# Patient Record
Sex: Male | Born: 1990 | Race: White | Hispanic: No | Marital: Married | Smoking: Never smoker
Health system: Southern US, Community
[De-identification: ages and names within clinical notes are randomized; demographics above are authoritative.]

## PROBLEM LIST (undated history)

## (undated) DIAGNOSIS — J309 Allergic rhinitis, unspecified: Secondary | ICD-10-CM

## (undated) DIAGNOSIS — K219 Gastro-esophageal reflux disease without esophagitis: Secondary | ICD-10-CM

## (undated) DIAGNOSIS — J45909 Unspecified asthma, uncomplicated: Secondary | ICD-10-CM

## (undated) HISTORY — DX: Allergic rhinitis, unspecified: J30.9

## (undated) HISTORY — DX: Gastro-esophageal reflux disease without esophagitis: K21.9

---

## 2020-09-28 ENCOUNTER — Ambulatory Visit (INDEPENDENT_AMBULATORY_CARE_PROVIDER_SITE_OTHER): Payer: BC Managed Care – PPO

## 2020-09-28 ENCOUNTER — Ambulatory Visit
Admission: RE | Admit: 2020-09-28 | Discharge: 2020-09-28 | Disposition: A | Discharge status: Home / Self Care | Payer: BC Managed Care – PPO | Source: Ambulatory Visit

## 2020-09-28 ENCOUNTER — Other Ambulatory Visit: Payer: Self-pay

## 2020-09-28 VITALS — BP 105/68 | HR 122 | Temp 99.0°F | Resp 22

## 2020-09-28 DIAGNOSIS — R059 Cough, unspecified: Secondary | ICD-10-CM

## 2020-09-28 DIAGNOSIS — R0781 Pleurodynia: Secondary | ICD-10-CM

## 2020-09-28 DIAGNOSIS — J22 Unspecified acute lower respiratory infection: Secondary | ICD-10-CM

## 2020-09-28 MED ORDER — DOXYCYCLINE HYCLATE 100 MG PO CAPS: 100.0000 mg | ORAL_CAPSULE | Freq: Two times a day (BID) | ORAL | 0 refills | Status: DC

## 2020-09-28 MED ORDER — AMOXICILLIN 500 MG PO CAPS: 1000.0000 mg | ORAL_CAPSULE | Freq: Three times a day (TID) | ORAL | 0 refills | Status: AC

## 2020-09-28 MED ORDER — PREDNISONE 10 MG (21) PO TBPK: ORAL_TABLET | Freq: Every day | ORAL | 0 refills | Status: DC

## 2020-09-28 MED ORDER — BENZONATATE 100 MG PO CAPS: 100.0000 mg | ORAL_CAPSULE | Freq: Three times a day (TID) | ORAL | 0 refills | Status: DC

## 2020-09-28 NOTE — Discharge Instructions (Signed)
Chest x-ray negative for cardiopulmonary disease Get plenty of rest and push fluids Will treat for lower respiratory infection Amoxicillin and doxycycline prescribed Prednisone prescribed Prescribed tessolone perles as needed for cough Use OTC medication as needed for symptomatic relief Follow up with PCP this week for recheck and/or if symptoms persists Return or go to ER if you have any new or worsening symptoms such as fever, chills, fatigue, shortness of breath, wheezing, chest pain, nausea, changes in bowel or bladder habits, etc...  Discussed with patient that we cannot rule out blood clot.  Chest discomfort may be secondary for cartilage inflammation.  Advised to go to the ED if symptoms did not improve over the next day or two with medications.

## 2020-09-28 NOTE — ED Provider Notes (Signed)
[facility]   [id number] 09/28/20 Arrival Time: 1138  Cc: COUGH  SUBJECTIVE:  Michael Huber is a 29 y.o. male who presents with mild productive cough x 1 month, worsening symptoms over the past 1-3 days.  Denies positive sick exposure or precipitating event.   Treated for bronchitis (steroid and z-pak) with temporary relief.  Denies aggravating factors.   Denies previous symptoms in the past.   RT sided chest discomfort with cough.  Denies fever, chills, fatigue, sinus pain, rhinorrhea, sore throat, SOB, wheezing, chest pain, nausea, changes in bowel or bladder habits.    ROS: As per HPI.  All other pertinent ROS negative.     History reviewed. No pertinent past medical history. History reviewed. No pertinent surgical history. No Known Allergies No current facility-administered medications on file prior to encounter.   Current Outpatient Medications on File Prior to Encounter  Medication Sig Dispense Refill  . FLUoxetine (PROZAC) 20 MG capsule Take 1 capsule by mouth daily.    Marland Kitchen albuterol (VENTOLIN HFA) 108 (90 Base) MCG/ACT inhaler Inhale 2 puffs into the lungs 4 (four) times daily.    Marland Kitchen FLOVENT HFA 44 MCG/ACT inhaler Inhale 1 puff into the lungs daily.      Social History   Socioeconomic History  . Marital status: Married    Spouse name: Not on file  . Number of children: Not on file  . Years of education: Not on file  . Highest education level: Not on file  Occupational History  . Not on file  Tobacco Use  . Smoking status: Never Smoker  . Smokeless tobacco: Never Used  Substance and Sexual Activity  . Alcohol use: Yes  . Drug use: Not Currently  . Sexual activity: Not on file  Other Topics Concern  . Not on file  Social History Narrative  . Not on file   Social Determinants of Health   Financial Resource Strain:   . Difficulty of Paying Living Expenses: Not on file  Food Insecurity:   . Worried About Programme researcher, broadcasting/film/video in the Last Year: Not on file   . Ran Out of Food in the Last Year: Not on file  Transportation Needs:   . Lack of Transportation (Medical): Not on file  . Lack of Transportation (Non-Medical): Not on file  Physical Activity:   . Days of Exercise per Week: Not on file  . Minutes of Exercise per Session: Not on file  Stress:   . Feeling of Stress : Not on file  Social Connections:   . Frequency of Communication with Friends and Family: Not on file  . Frequency of Social Gatherings with Friends and Family: Not on file  . Attends Religious Services: Not on file  . Active Member of Clubs or Organizations: Not on file  . Attends Banker Meetings: Not on file  . Marital Status: Not on file  Intimate Partner Violence:   . Fear of Current or Ex-Partner: Not on file  . Emotionally Abused: Not on file  . Physically Abused: Not on file  . Sexually Abused: Not on file   History reviewed. No pertinent family history.   OBJECTIVE:  Vitals:   09/28/20 1202  BP: 105/68  Pulse: (!) 122  Resp: (!) 22  Temp: 99 F (37.2 C)  SpO2: 95%     General appearance: Alert, appears fatigued, but nontoxic; speaking in full sentences without difficulty HEENT:NCAT; Ears: EACs clear, TMs pearly gray; Eyes: PERRL.  EOM grossly intact.  Nose: nares patent without rhinorrhea; Throat: tonsils nonerythematous or enlarged, uvula midline  Neck: supple without LAD Lungs: clear to auscultation bilaterally without adventitious breath sounds; normal respiratory effort; persistent cough present Heart: regular rate and rhythm.   Skin: warm and dry Psychological: alert and cooperative; normal mood and affect  DIAGNOSTIC STUDIES:  DG Chest 2 View  Result Date: 09/28/2020 CLINICAL DATA:  Cough for a month. Right-sided rib pain. No known injury. EXAM: CHEST - 2 VIEW COMPARISON:  None. FINDINGS: The heart size and mediastinal contours are within normal limits. Both lungs are clear. The visualized skeletal structures are unremarkable.  IMPRESSION: No active cardiopulmonary disease. Electronically Signed   By: Gerome Sam III M.D   On: 09/28/2020 12:24    X-rays negative for cardiopulmonary disease  I have reviewed the x-rays myself and the radiologist interpretation. I am in agreement with the radiologist interpretation.     ASSESSMENT & PLAN:  1. Cough   2. Lower respiratory infection     Meds ordered this encounter  Medications  . amoxicillin (AMOXIL) 500 MG capsule    Sig: Take 2 capsules (1,000 mg total) by mouth 3 (three) times daily for 10 days.    Dispense:  60 capsule    Refill:  0    Order Specific Question:   Supervising Provider    Answer:   Eustace Moore [[id number]]  . doxycycline (VIBRAMYCIN) 100 MG capsule    Sig: Take 1 capsule (100 mg total) by mouth 2 (two) times daily.    Dispense:  20 capsule    Refill:  0    Order Specific Question:   Supervising Provider    Answer:   Eustace Moore [[id number]]  . predniSONE (STERAPRED UNI-PAK 21 TAB) 10 MG (21) TBPK tablet    Sig: Take by mouth daily. Take 6 tabs by mouth daily  for 2 days, then 5 tabs for 2 days, then 4 tabs for 2 days, then 3 tabs for 2 days, 2 tabs for 2 days, then 1 tab by mouth daily for 2 days    Dispense:  42 tablet    Refill:  0    Order Specific Question:   Supervising Provider    Answer:   Eustace Moore [[id number]]  . benzonatate (TESSALON) 100 MG capsule    Sig: Take 1 capsule (100 mg total) by mouth every 8 (eight) hours.    Dispense:  21 capsule    Refill:  0    Order Specific Question:   Supervising Provider    Answer:   Eustace Moore [[id number]]    Orders Placed This Encounter  Procedures  . DG Chest 2 View    Standing Status:   Standing    Number of Occurrences:   1    Order Specific Question:   Reason for Exam (SYMPTOM  OR DIAGNOSIS REQUIRED)    Answer:   cough for past month    Chest x-ray negative for cardiopulmonary disease Get plenty of rest and push fluids Will treat for lower respiratory  infection Amoxicillin and doxycycline prescribed Prednisone prescribed Prescribed tessolone perles as needed for cough Use OTC medication as needed for symptomatic relief Follow up with PCP this week for recheck and/or if symptoms persists Return or go to ER if you have any new or worsening symptoms such as fever, chills, fatigue, shortness of breath, wheezing, chest pain, nausea, changes in bowel or bladder habits, etc...  Discussed with patient that we cannot rule out  blood clot.  Chest discomfort may be secondary for cartilage inflammation.  Advised to go to the ED if symptoms did not improve over the next day or two with medications.  Reviewed expectations re: course of current medical issues. Questions answered. Outlined signs and symptoms indicating need for more acute intervention. Patient verbalized understanding. After Visit Summary given.          Rennis Harding, PA-C 09/28/20 1233

## 2020-09-28 NOTE — ED Triage Notes (Signed)
Pt was treated for bronchitis 2 weeks ago then developed cough, body aches and fever yesterday. Pt also has pain in right rib area with cough

## 2020-09-28 NOTE — ED Triage Notes (Signed)
Pt states cough has been persistent for past month and worsened in last couple of days, has had course of steroids and antibiotics

## 2020-10-13 DIAGNOSIS — Z20828 Contact with and (suspected) exposure to other viral communicable diseases: Secondary | ICD-10-CM | POA: Diagnosis not present

## 2020-10-13 DIAGNOSIS — J40 Bronchitis, not specified as acute or chronic: Secondary | ICD-10-CM | POA: Diagnosis not present

## 2020-10-13 DIAGNOSIS — T50B95A Adverse effect of other viral vaccines, initial encounter: Secondary | ICD-10-CM | POA: Diagnosis not present

## 2020-10-27 DIAGNOSIS — R059 Cough, unspecified: Secondary | ICD-10-CM | POA: Diagnosis not present

## 2020-10-27 DIAGNOSIS — J069 Acute upper respiratory infection, unspecified: Secondary | ICD-10-CM | POA: Diagnosis not present

## 2020-10-27 DIAGNOSIS — R0781 Pleurodynia: Secondary | ICD-10-CM | POA: Diagnosis not present

## 2020-12-18 ENCOUNTER — Institutional Professional Consult (permissible substitution): Payer: BC Managed Care – PPO | Admitting: Internal Medicine

## 2020-12-31 ENCOUNTER — Ambulatory Visit (INDEPENDENT_AMBULATORY_CARE_PROVIDER_SITE_OTHER): Payer: BC Managed Care – PPO | Admitting: Internal Medicine

## 2020-12-31 ENCOUNTER — Encounter: Payer: Self-pay | Admitting: Internal Medicine

## 2020-12-31 ENCOUNTER — Other Ambulatory Visit: Payer: Self-pay

## 2020-12-31 VITALS — BP 120/90 | HR 92 | Temp 97.9°F | Ht 72.0 in | Wt 243.8 lb

## 2020-12-31 DIAGNOSIS — K219 Gastro-esophageal reflux disease without esophagitis: Secondary | ICD-10-CM

## 2020-12-31 DIAGNOSIS — J301 Allergic rhinitis due to pollen: Secondary | ICD-10-CM

## 2020-12-31 DIAGNOSIS — J4520 Mild intermittent asthma, uncomplicated: Secondary | ICD-10-CM | POA: Diagnosis not present

## 2020-12-31 NOTE — Patient Instructions (Signed)
The patient should have follow up scheduled with myself in 6 months.   Prior to next visit patient should have: Spirometry/Feno  Keep taking the cetirizine daily. Start flonase daily for your allergic rhinitis. Take nexium.  I think you will benefit from taking flovent as needed when your breathing gets worse. I think you should continue doing so as needed, especially when seasons change fall/winter.   Flonase - 1 spray on each side of your nose twice a day for first week, then 1 spray on each side.   Instructions for use:  If you also use a saline nasal spray or rinse, use that first.  Position the head with the chin slightly tucked. Use the right hand to spray into the left nostril and the right hand to spray into the left nostril.   Point the bottle away from the septum of your nose (cartilage that divides the two sides of your nose).   Hold the nostril closed on the opposite side from where you will spray  Spray once and gently sniff to pull the medicine into the higher parts of your nose.  Don't sniff too hard as the medicine will drain down the back of your throat instead.  Repeat with a second spray on the same side if prescribed.  Repeat on the other side of your nose.  Take the albuterol rescue inhaler every 4 to 6 hours as needed for wheezing or shortness of breath. You can also take it 15 minutes before exercise or exertional activity. Side effects include heart racing or pounding, jitters or anxiety. If you have a history of an irregular heart rhythm, it can make this worse. Can also give some patients a hard time sleeping.  To inhale the aerosol using an inhaler, follow these steps:  1. Remove the protective dust cap from the end of the mouthpiece. If the dust cap was not placed on the mouthpiece, check the mouthpiece for dirt or other objects. Be sure that the canister is fully and firmly inserted in the mouthpiece. 2. If you are using the inhaler for the first time or if  you have not used the inhaler in more than 14 days, you will need to prime it. You may also need to prime the inhaler if it has been dropped. Ask your pharmacist or check the manufacturer's information if this happens. To prime the inhaler, shake it well and then press down on the canister 4 times to release 4 sprays into the air, away from your face. Be careful not to get albuterol in your eyes. 3. Shake the inhaler well. 4. Breathe out as completely as possible through your mouth. 4. Hold the canister with the mouthpiece on the bottom, facing you and the canister pointing upward. Place the open end of the mouthpiece into your mouth. Close your lips tightly around the mouthpiece. 6. Breathe in slowly and deeply through the mouthpiece.At the same time, press down once on the container to spray the medication into your mouth. 7. Try to hold your breath for 10 seconds. remove the inhaler, and breathe out slowly. 8. If you were told to use 2 puffs, wait 1 minute and then repeat steps 3-7. 9. Replace the protective cap on the inhaler. 10. Clean your inhaler regularly. Follow the manufacturer's directions carefully and ask your doctor or pharmacist if you have any questions about cleaning your inhaler.  Check the back of the inhaler to keep track of the total number of doses left on the  inhaler.

## 2020-12-31 NOTE — Progress Notes (Signed)
Michael Huber    [id number]    02/03/1991  Primary Care Physician:Sasser, Clarene Critchley, MD  Referring Physician: Estanislado Pandy, MD [address] [place],  [place] [address] Reason for Consultation: cough and shortness of breath.  Date of Consultation: 12/31/2020  Chief complaint:   Chief Complaint  Patient presents with  . Consult    Cough, chest pain since September.  Treated with antibiotics and prednisone.   Pain is better.  Has an allergy cough and post nasal drip which is chronic.     HPI: Michael Huber is a 30 y.o. .gentleman here for cough and chest pain. Symptoms started early October with sinus issues that then moved into his chest with tightness, coughing, chest pain.  Has been seen in urgent care, treated as pneumonia. Having rib and chest pain from the coughing. Feeling fatigued. Had a low grade fever once. Saw his family doctor again and was treated with another course of prednisone. He notes having normal chest xrays at that time.   He is now feeling much better but is concerned enough. Notes since high school having allergies and post-nasal drip. This is year round. He also has acid reflux and clears his throat a lot. 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 He continues to have these symptoms.   He does have a flovent and albuterol. Took albuterol when he was sick in December, but nothing in the last month. He does not take the flovent inhaler either. He only took it briefly when he was sick. For his allergies he takes cetirizine daily. For his acid reflux he takes nexium as needed.   Now that he is feeling better, on a daily basis the cough is what bothers him, no as much the sob.   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  Smoking history: never smoker  Social History   Occupational History  .  Not on file  Tobacco Use  . Smoking status: Never Smoker  . Smokeless tobacco: Never Used  Substance and Sexual Activity  . Alcohol use: Yes  . Drug use: Not Currently  . Sexual activity: Not on file    Relevant family history: Family History  Problem Relation Age of Onset  . Allergic Disorder Mother   . Allergic rhinitis Brother   . Asthma Neg Hx     Past Medical History:  Diagnosis Date  . Allergic rhinitis   . GERD (gastroesophageal reflux disease)     History reviewed. No pertinent surgical history.   Physical Exam: Blood pressure 120/90, pulse 92, temperature 97.9 F (36.6 C), temperature source Temporal, height 6' (1.829 m), weight 243 lb 12.8 oz (110.6 kg), SpO2 98 %. Gen:      No acute distress, frequent coughing ENT:  no nasal polyps, mucus membranes moist, +cobblestoning and erythema Lungs:    No increased respiratory effort, symmetric chest wall excursion, clear to auscultation bilaterally, no wheezes or crackles CV:         Regular rate and rhythm; no murmurs, rubs, or gallops.  No pedal edema Abd:      + bowel sounds; soft, non-tender; no distension MSK: no acute synovitis of DIP or PIP joints, no mechanics hands.  Skin:      Warm and dry; no rashes Neuro: normal  speech, no focal facial asymmetry Psych: alert and oriented x3, normal mood and affect   Data Reviewed/Medical Decision Making:  Independent interpretation of tests: Imaging: None on file  PFTs: None on file  Labs:  None in our system  Immunization status:  Immunization History  Administered Date(s) Administered  . Influenza,inj,Quad PF,6+ Mos 09/26/2020  . Moderna Sars-Covid-2 Vaccination 12/29/2019, 01/26/2020, 10/11/2020    . I reviewed prior external note(s) from PCP Dr. Neita Carp . I reviewed the result(s) of the labs and imaging as noted above.  . I have ordered PFTs   Assessment:  Mild intermittent asthma Perennial allergic  Rhinitis GERD   Plan/Recommendations: Recommend taking PPI once daily instead of intermittently.  For his asthma I recommend albuterol prior to exercise and as needed for dyspnea. I think he will probably benefit from intermittent and PRN ICS especially in the fall/winter when his symptoms are more active for asthma. I have ordered spirometry and FeNO.  Continue cetirizine and start scheduled flonase. For rhinitis  We discussed disease management and progression at length today regarding asthma, gerd and rhinitis and their interplay in worsening symptoms.    Return to Care: Return in about 6 months (around 06/30/2021).  Durel Salts, MD Pulmonary and Critical Care Medicine [facility] Office:[phone]  CC: Sasser, Clarene Critchley, MD

## 2021-01-03 DIAGNOSIS — Z6832 Body mass index (BMI) 32.0-32.9, adult: Secondary | ICD-10-CM | POA: Diagnosis not present

## 2021-01-03 DIAGNOSIS — L0390 Cellulitis, unspecified: Secondary | ICD-10-CM | POA: Diagnosis not present

## 2021-01-23 DIAGNOSIS — J4530 Mild persistent asthma, uncomplicated: Secondary | ICD-10-CM | POA: Diagnosis not present

## 2021-01-23 DIAGNOSIS — F900 Attention-deficit hyperactivity disorder, predominantly inattentive type: Secondary | ICD-10-CM | POA: Diagnosis not present

## 2021-01-23 DIAGNOSIS — K219 Gastro-esophageal reflux disease without esophagitis: Secondary | ICD-10-CM | POA: Diagnosis not present

## 2021-01-23 DIAGNOSIS — F411 Generalized anxiety disorder: Secondary | ICD-10-CM | POA: Diagnosis not present

## 2021-01-23 DIAGNOSIS — Z1389 Encounter for screening for other disorder: Secondary | ICD-10-CM | POA: Diagnosis not present

## 2021-01-23 DIAGNOSIS — Z1331 Encounter for screening for depression: Secondary | ICD-10-CM | POA: Diagnosis not present

## 2021-04-15 DIAGNOSIS — D3131 Benign neoplasm of right choroid: Secondary | ICD-10-CM | POA: Diagnosis not present

## 2021-05-12 DIAGNOSIS — F900 Attention-deficit hyperactivity disorder, predominantly inattentive type: Secondary | ICD-10-CM | POA: Diagnosis not present

## 2021-05-12 DIAGNOSIS — J4530 Mild persistent asthma, uncomplicated: Secondary | ICD-10-CM | POA: Diagnosis not present

## 2021-05-12 DIAGNOSIS — K219 Gastro-esophageal reflux disease without esophagitis: Secondary | ICD-10-CM | POA: Diagnosis not present

## 2021-05-12 DIAGNOSIS — F411 Generalized anxiety disorder: Secondary | ICD-10-CM | POA: Diagnosis not present

## 2021-07-10 ENCOUNTER — Other Ambulatory Visit (HOSPITAL_COMMUNITY): Payer: BC Managed Care – PPO

## 2021-07-20 ENCOUNTER — Other Ambulatory Visit: Payer: Self-pay

## 2021-07-20 ENCOUNTER — Ambulatory Visit (INDEPENDENT_AMBULATORY_CARE_PROVIDER_SITE_OTHER): Payer: BC Managed Care – PPO | Admitting: Internal Medicine

## 2021-07-20 ENCOUNTER — Other Ambulatory Visit (INDEPENDENT_AMBULATORY_CARE_PROVIDER_SITE_OTHER): Payer: BC Managed Care – PPO

## 2021-07-20 DIAGNOSIS — J4520 Mild intermittent asthma, uncomplicated: Secondary | ICD-10-CM

## 2021-07-20 LAB — POCT EXHALED NITRIC OXIDE: FeNO level (ppb): 18

## 2021-07-20 NOTE — Progress Notes (Signed)
Spiro/ Feno completed today. Feno 18

## 2021-07-20 NOTE — Progress Notes (Signed)
PFT

## 2021-07-21 LAB — PULMONARY FUNCTION TEST
FEF 25-75 Post: 4.15 L/sec
FEF 25-75 Pre: 4.12 L/sec
FEF2575-%Change-Post: 0 %
FEF2575-%Pred-Post: 90 %
FEF2575-%Pred-Pre: 90 %
FEV1-%Change-Post: 0 %
FEV1-%Pred-Post: 102 %
FEV1-%Pred-Pre: 102 %
FEV1-Post: 4.78 L
FEV1-Pre: 4.77 L
FEV1FVC-%Change-Post: 3 %
FEV1FVC-%Pred-Pre: 95 %
FEV6-%Change-Post: -2 %
FEV6-%Pred-Post: 104 %
FEV6-%Pred-Pre: 107 %
FEV6-Post: 5.94 L
FEV6-Pre: 6.11 L
FEV6FVC-%Change-Post: 0 %
FEV6FVC-%Pred-Post: 101 %
FEV6FVC-%Pred-Pre: 101 %
FVC-%Change-Post: -3 %
FVC-%Pred-Post: 102 %
FVC-%Pred-Pre: 106 %
FVC-Post: 5.94 L
FVC-Pre: 6.14 L
Post FEV1/FVC ratio: 81 %
Post FEV6/FVC ratio: 100 %
Pre FEV1/FVC ratio: 78 %
Pre FEV6/FVC Ratio: 100 %

## 2021-08-20 DIAGNOSIS — F411 Generalized anxiety disorder: Secondary | ICD-10-CM | POA: Diagnosis not present

## 2021-08-20 DIAGNOSIS — K219 Gastro-esophageal reflux disease without esophagitis: Secondary | ICD-10-CM | POA: Diagnosis not present

## 2021-08-20 DIAGNOSIS — J4530 Mild persistent asthma, uncomplicated: Secondary | ICD-10-CM | POA: Diagnosis not present

## 2021-08-20 DIAGNOSIS — Z23 Encounter for immunization: Secondary | ICD-10-CM | POA: Diagnosis not present

## 2021-08-20 DIAGNOSIS — F900 Attention-deficit hyperactivity disorder, predominantly inattentive type: Secondary | ICD-10-CM | POA: Diagnosis not present

## 2021-09-09 DIAGNOSIS — J069 Acute upper respiratory infection, unspecified: Secondary | ICD-10-CM | POA: Diagnosis not present

## 2021-09-09 DIAGNOSIS — J0100 Acute maxillary sinusitis, unspecified: Secondary | ICD-10-CM | POA: Diagnosis not present

## 2021-09-13 ENCOUNTER — Encounter: Payer: Self-pay | Admitting: Emergency Medicine

## 2021-09-13 ENCOUNTER — Ambulatory Visit: Payer: BC Managed Care – PPO

## 2021-09-13 ENCOUNTER — Other Ambulatory Visit: Payer: Self-pay

## 2021-09-13 ENCOUNTER — Ambulatory Visit
Admission: EM | Admit: 2021-09-13 | Discharge: 2021-09-13 | Disposition: A | Discharge status: Home / Self Care | Payer: BC Managed Care – PPO | Attending: Emergency Medicine | Admitting: Emergency Medicine

## 2021-09-13 DIAGNOSIS — J0100 Acute maxillary sinusitis, unspecified: Secondary | ICD-10-CM | POA: Diagnosis not present

## 2021-09-13 DIAGNOSIS — J4521 Mild intermittent asthma with (acute) exacerbation: Secondary | ICD-10-CM

## 2021-09-13 MED ORDER — AEROCHAMBER PLUS MISC: 2 refills | Status: AC

## 2021-09-13 MED ORDER — HYDROCOD POLST-CPM POLST ER 10-8 MG/5ML PO SUER: 5.0000 mL | Freq: Two times a day (BID) | ORAL | 0 refills | Status: DC | PRN

## 2021-09-13 MED ORDER — LEVOFLOXACIN 750 MG PO TABS: 750.0000 mg | ORAL_TABLET | Freq: Every day | ORAL | 0 refills | Status: AC

## 2021-09-13 MED ORDER — BENZONATATE 200 MG PO CAPS: 200.0000 mg | ORAL_CAPSULE | Freq: Three times a day (TID) | ORAL | 0 refills | Status: DC | PRN

## 2021-09-13 MED ORDER — FLUTICASONE PROPIONATE 50 MCG/ACT NA SUSP: 2.0000 | Freq: Every day | NASAL | 0 refills | Status: DC

## 2021-09-13 MED ORDER — IBUPROFEN 600 MG PO TABS: 600.0000 mg | ORAL_TABLET | Freq: Four times a day (QID) | ORAL | 0 refills | Status: DC | PRN

## 2021-09-13 NOTE — ED Triage Notes (Signed)
Symptoms started 10/5.  Saw PCP on Wednesday and started on amoxicillin and prednisone.  States symptoms have become worse.  Cough, facial pain, sore throat, states chest hurts when coughing.

## 2021-09-13 NOTE — Discharge Instructions (Addendum)
Start Mucinex-D to keep the mucous thin and to decongest you.   You may take 600 mg of motrin with 1000 mg of tylenol up to 3-4 times a day as needed for pain. This is an effective combination for pain.  Use a NeilMed sinus rinse with distilled water as often as you want to to reduce nasal congestion. Follow the directions on the box.  Flonase, finish Levaquin unless a provider tells you to stop.  Tessalon for the cough during the day, Tussionex for the cough at night.  2 puffs from your albuterol inhaler using your spacer every 4 hours for 2 days, then every 6 hours for 2 days, then as needed.  May back off on this if you start to feel better sooner.  Go to www.goodrx.com to look up your medications. This will give you a list of where you can find your prescriptions at the most affordable prices. Or you can ask the pharmacist what the cash Trickel is. This is frequently cheaper than going through insurance.

## 2021-09-13 NOTE — ED Provider Notes (Signed)
HPI  SUBJECTIVE: Michael Huber is a 30 y.o. male who presents with a URI starting on 10/5.  States that he got better and then get worse.  He saw his doctor 5 days ago, was thought to have a sinusitis, was prescribed amoxicillin and a prednisone taper.  He is on day number 5 out of 10 of amoxicillin and 5 out of 6 of prednisone.  He reports left maxillary sinus pain, nasal congestion, yellow rhinorrhea, cough productive of the same material as his nasal congestion, sore throat, postnasal drip, malaise, chest soreness secondary to coughing, occasional mild dyspnea on exertion, upper left-sided dental pain.  States that he is getting worse.  He is unable to sleep at night secondary to the cough.  No wheezing, facial swelling.  He has tried Mucinex DM, Robitussin-DM, Delsym, honey tea, increasing fluids in addition to the amoxicillin and prednisone without improvement in his symptoms.  Symptoms worse at night.  He has a past medical history of exercise-induced asthma for which he takes Flovent.  He uses his albuterol inhaler as needed.  He has not used his albuterol during this time.  He also has a history of GERD and allergies.  WFU:XNATFT, Clarene Critchley, MD   Past Medical History:  Diagnosis Date   Allergic rhinitis    GERD (gastroesophageal reflux disease)     History reviewed. No pertinent surgical history.  Family History  Problem Relation Age of Onset   Allergic Disorder Mother    Allergic rhinitis Brother    Asthma Neg Hx     Social History   Tobacco Use   Smoking status: Never   Smokeless tobacco: Never  Substance Use Topics   Alcohol use: Yes   Drug use: Not Currently    No current facility-administered medications for this encounter.  Current Outpatient Medications:    benzonatate (TESSALON) 200 MG capsule, Take 1 capsule (200 mg total) by mouth 3 (three) times daily as needed for cough., Disp: 30 capsule, Rfl: 0   chlorpheniramine-HYDROcodone (TUSSIONEX PENNKINETIC ER) 10-8 MG/5ML  SUER, Take 5 mLs by mouth every 12 (twelve) hours as needed for cough., Disp: 60 mL, Rfl: 0   fluticasone (FLONASE) 50 MCG/ACT nasal spray, Place 2 sprays into both nostrils daily., Disp: 16 g, Rfl: 0   ibuprofen (ADVIL) 600 MG tablet, Take 1 tablet (600 mg total) by mouth every 6 (six) hours as needed., Disp: 30 tablet, Rfl: 0   levofloxacin (LEVAQUIN) 750 MG tablet, Take 1 tablet (750 mg total) by mouth daily for 7 days., Disp: 7 tablet, Rfl: 0   predniSONE (DELTASONE) 20 MG tablet, Take 20 mg by mouth daily with breakfast., Disp: , Rfl:    Spacer/Aero-Holding Chambers (AEROCHAMBER PLUS) inhaler, Use with inhaler, Disp: 1 each, Rfl: 2   albuterol (VENTOLIN HFA) 108 (90 Base) MCG/ACT inhaler, Inhale 2 puffs into the lungs 4 (four) times daily., Disp: , Rfl:    esomeprazole (NEXIUM) 40 MG capsule, Take by mouth., Disp: , Rfl:    FLOVENT HFA 44 MCG/ACT inhaler, Inhale 1 puff into the lungs daily., Disp: , Rfl:    FLUoxetine (PROZAC) 20 MG capsule, Take 1 capsule by mouth daily., Disp: , Rfl:    methylphenidate 36 MG PO CR tablet, Take 1 tablet by mouth daily., Disp: , Rfl:   No Known Allergies   ROS  As noted in HPI.   Physical Exam  BP 125/84 (BP Location: Right Arm)   Pulse (!) 110   Temp 98.4 F (36.9 C) (  Oral)   Resp 20   SpO2 95%   Constitutional: Well developed, well nourished, no acute distress.  Coughing. Eyes:  EOMI, conjunctiva normal bilaterally HENT: Normocephalic, atraumatic,mucus membranes moist.  Purulent nasal congestion.  Erythematous, swollen turbinates.  Positive exquisite left side maxillary sinus tenderness.  No obvious postnasal drip. Respiratory: Normal inspiratory effort, lungs clear bilaterally.  Positive anterior and lateral chest wall tenderness Cardiovascular: Regular tachycardia, no murmurs rubs or gallops GI: nondistended skin: No rash, skin intact Musculoskeletal: no deformities Neurologic: Alert & oriented x 3, no focal neuro deficits Psychiatric:  Speech and behavior appropriate   ED Course   Medications - No data to display  No orders of the defined types were placed in this encounter.   No results found for this or any previous visit (from the past 24 hour(s)). No results found.  ED Clinical Impression  1. Acute non-recurrent maxillary sinusitis   2. Mild intermittent asthma with acute exacerbation      ED Assessment/Plan  [place] Narcotic database reviewed for this patient, and feel that the risk/benefit ratio today is favorable for proceeding with a prescription for controlled substance.  Patient with a maxillary sinusitis.  I also suspect an early asthma exacerbation due to the extensive cough.  We will have him discontinue the amoxicillin.  Home with Levaquin 750 mg daily for 7 days.  Discussed with him the risk of tendon rupture.  Saline nasal irrigation , Flonase, Tessalon, Tussionex, Tylenol/ibuprofen, regularly scheduled albuterol inhaler with a spacer for the next 4 days.  He does not need a refill on his albuterol inhaler.  Follow-up with PMD as needed   Discussed labs MDM, treatment plan, and plan for follow-up with patient. patient agrees with plan.   Meds ordered this encounter  Medications   ibuprofen (ADVIL) 600 MG tablet    Sig: Take 1 tablet (600 mg total) by mouth every 6 (six) hours as needed.    Dispense:  30 tablet    Refill:  0   fluticasone (FLONASE) 50 MCG/ACT nasal spray    Sig: Place 2 sprays into both nostrils daily.    Dispense:  16 g    Refill:  0   levofloxacin (LEVAQUIN) 750 MG tablet    Sig: Take 1 tablet (750 mg total) by mouth daily for 7 days.    Dispense:  7 tablet    Refill:  0   benzonatate (TESSALON) 200 MG capsule    Sig: Take 1 capsule (200 mg total) by mouth 3 (three) times daily as needed for cough.    Dispense:  30 capsule    Refill:  0   chlorpheniramine-HYDROcodone (TUSSIONEX PENNKINETIC ER) 10-8 MG/5ML SUER    Sig: Take 5 mLs by mouth every 12 (twelve) hours as needed  for cough.    Dispense:  60 mL    Refill:  0   Spacer/Aero-Holding Chambers (AEROCHAMBER PLUS) inhaler    Sig: Use with inhaler    Dispense:  1 each    Refill:  2    Please educate patient on use      *This clinic note was created using Dragon dictation software. Therefore, there may be occasional mistakes despite careful proofreading.  ?    Domenick Gong, MD 09/14/21 [phone]

## 2021-09-24 DIAGNOSIS — J209 Acute bronchitis, unspecified: Secondary | ICD-10-CM | POA: Diagnosis not present

## 2021-10-01 ENCOUNTER — Ambulatory Visit: Payer: BC Managed Care – PPO | Admitting: Internal Medicine

## 2021-10-09 ENCOUNTER — Encounter: Payer: Self-pay | Admitting: Internal Medicine

## 2021-10-09 ENCOUNTER — Ambulatory Visit (INDEPENDENT_AMBULATORY_CARE_PROVIDER_SITE_OTHER): Payer: BC Managed Care – PPO | Admitting: Internal Medicine

## 2021-10-09 ENCOUNTER — Other Ambulatory Visit: Payer: Self-pay

## 2021-10-09 VITALS — BP 132/88 | HR 93 | Ht 72.0 in | Wt 241.8 lb

## 2021-10-09 DIAGNOSIS — J301 Allergic rhinitis due to pollen: Secondary | ICD-10-CM

## 2021-10-09 DIAGNOSIS — J4541 Moderate persistent asthma with (acute) exacerbation: Secondary | ICD-10-CM

## 2021-10-09 DIAGNOSIS — K219 Gastro-esophageal reflux disease without esophagitis: Secondary | ICD-10-CM

## 2021-10-09 MED ORDER — ADVAIR HFA 230-21 MCG/ACT IN AERO: 2.0000 | INHALATION_SPRAY | Freq: Two times a day (BID) | RESPIRATORY_TRACT | 12 refills | Status: DC

## 2021-10-09 NOTE — Patient Instructions (Addendum)
Please schedule follow up scheduled with myself in 3 months.  If my schedule is not open yet, we will contact you with a reminder closer to that time.  Stop flovent. We are increasing your therapy to advair 2 puffs twice a day. Gargle after use.  Continue singulair, acid reflux medicine, and anti-histamine with claritin. Can also try switching it up with zyrtec at night time.  Continue neti pot before using flonase. Increase flonase to 1 spray BID .  Take the albuterol rescue inhaler every 4 to 6 hours as needed for wheezing or shortness of breath. You can also take it 15 minutes before exercise or exertional activity. Side effects include heart racing or pounding, jitters or anxiety. If you have a history of an irregular heart rhythm, it can make this worse. Can also give some patients a hard time sleeping.   By learning about asthma and how it can be controlled, you take an important step toward managing this disease. Work closely with your asthma care team to learn all you can about your asthma, how to avoid triggers, what your medications do, and how to take them correctly. With proper care, you can live free of asthma symptoms and maintain a normal, healthy lifestyle.   What is asthma? Asthma is a chronic disease that affects the airways of the lungs. During normal breathing, the bands of muscle that surround the airways are relaxed and air moves freely. During an asthma episode or "attack," there are three main changes that stop air from moving easily through the airways: The bands of muscle that surround the airways tighten and make the airways narrow. This tightening is called bronchospasm.  The lining of the airways becomes swollen or inflamed.  The cells that line the airways produce more mucus, which is thicker than normal and clogs the airways.  These three factors - bronchospasm, inflammation, and mucus production - cause symptoms such as difficulty breathing, wheezing, and  coughing.  What are the most common symptoms of asthma? Asthma symptoms are not the same for everyone. They can even change from episode to episode in the same person. Also, you may have only one symptom of asthma, such as cough, but another person may have all the symptoms of asthma. It is important to know all the symptoms of asthma and to be aware that your asthma can present in any of these ways at any time. The most common symptoms include: Coughing, especially at night  Shortness of breath  Wheezing  Chest tightness, pain, or pressure   Who is affected by asthma? Asthma affects 22 million Americans; about 6 million of these are children under age 45. People who have a family history of asthma have an increased risk of developing the disease. Asthma is also more common in people who have allergies or who are exposed to tobacco smoke. However, anyone can develop asthma at any time. Some people may have asthma all of their lives, while others may develop it as adults.  What causes asthma? The airways in a person with asthma are very sensitive and react to many things, or "triggers." Contact with these triggers causes asthma symptoms. One of the most important parts of asthma control is to identify your triggers and then avoid them when possible. The only trigger you do not want to avoid is exercise. Pre-treatment with medicines before exercise can allow you to stay active yet avoid asthma symptoms. Common asthma triggers include: Infections (colds, viruses, flu, sinus infections)  Exercise  Weather (changes in temperature and/or humidity, cold air)  Tobacco smoke  Allergens (dust mites, pollens, pets, mold spores, cockroaches, and sometimes foods)  Irritants (strong odors from cleaning products, perfume, wood smoke, air pollution)  Strong emotions such as crying or laughing hard  Some medications   How is asthma diagnosed? To diagnose asthma, your doctor will first review your medical  history, family history, and symptoms. Your doctor will want to know any past history of breathing problems you may have had, as well as a family history of asthma, allergies, eczema (a bumpy, itchy skin rash caused by allergies), or other lung disease. It is important that you describe your symptoms in detail (cough, wheeze, shortness of breath, chest tightness), including when and how often they occur. The doctor will perform a physical examination and listen to your heart and lungs. He or she may also order breathing tests, allergy tests, blood tests, and chest and sinus X-rays. The tests will find out if you do have asthma and if there are any other conditions that are contributing factors.  How is asthma treated? Asthma can be controlled, but not cured. It is not normal to have frequent symptoms, trouble sleeping, or trouble completing tasks. Appropriate asthma care will prevent symptoms and visits to the emergency room and hospital. Asthma medicines are one of the mainstays of asthma treatment. The drugs used to treat asthma are explained below.  Anti-inflammatories: These are the most important drugs for most people with asthma. Anti-inflammatory drugs reduce swelling and mucus production in the airways. As a result, airways are less sensitive and less likely to react to triggers. These medications need to be taken daily and may need to be taken for several weeks before they begin to control asthma. Anti-inflammatory medicines lead to fewer symptoms, better airflow, less sensitive airways, less airway damage, and fewer asthma attacks. If taken every day, they CONTROL or prevent asthma symptoms.   Bronchodilators: These drugs relax the muscle bands that tighten around the airways. This action opens the airways, letting more air in and out of the lungs and improving breathing. Bronchodilators also help clear mucus from the lungs. As the airways open, the mucus moves more freely and can be coughed out  more easily. In short-acting forms, bronchodilators RELIEVE or stop asthma symptoms by quickly opening the airways and are very helpful during an asthma episode. In long-acting forms, bronchodilators provide CONTROL of asthma symptoms and prevent asthma episodes.  Asthma drugs can be taken in a variety of ways. Inhaling the medications by using a metered dose inhaler, dry powder inhaler, or nebulizer is one way of taking asthma medicines. Oral medicines (pills or liquids you swallow) may also be prescribed.  Asthma severity Asthma is classified as either "intermittent" (comes and goes) or "persistent" (lasting). Persistent asthma is further described as being mild, moderate, or severe. The severity of asthma is based on how often you have symptoms both during the day and night, as well as by the results of lung function tests and by how well you can perform activities. The "severity" of asthma refers to how "intense" or "strong" your asthma is.  Asthma control Asthma control is the goal of asthma treatment. Regardless of your asthma severity, it may or may not be controlled. Asthma control means: You are able to do everything you want to do at work and home  You have no (or minimal) asthma symptoms  You do not wake up from your sleep or earlier than usual in  the morning due to asthma  You rarely need to use your reliever medicine (inhaler)  Another major part of your treatment is that you are happy with your asthma care and believe your asthma is controlled.  Monitoring symptoms A key part of treatment is keeping track of how well your lungs are working. Monitoring your symptoms  what they are, how and when they happen, and how severe they are  is an important part of being able to control your asthma.  Sometimes asthma is monitored using a peak flow meter. A peak flow (PF) meter measures how fast the air comes out of your lungs. It can help you know when your asthma is getting worse, sometimes  even before you have symptoms. By taking daily peak flow readings, you can learn when to adjust medications to keep asthma under good control. It is also used to create your asthma action plan (see below). Your doctor can use your peak flow readings to adjust your treatment plan in some cases.  Asthma Action Plan Based on your history and asthma severity, you and your doctor will develop a care plan called an "asthma action plan." The asthma action plan describes when and how to use your medicines, actions to take when asthma worsens, and when to seek emergency care. Make sure you understand this plan. If you do not, ask your asthma care provider any questions you may have. Your asthma action plan is one of the keys to controlling asthma. Keep it readily available to remind you of what you need to do every day to control asthma and what you need to do when symptoms occur.  Goals of asthma therapy These are the goals of asthma treatment: Live an active, normal life  Prevent chronic and troublesome symptoms  Attend work or school every day  Perform daily activities without difficulty  Stop urgent visits to the doctor, emergency department, or hospital  Use and adjust medications to control asthma with few or no side effects

## 2021-10-09 NOTE — Progress Notes (Signed)
Riggin Cuttino    [id number]    08/06/1991  Primary Care Physician:Sasser, Clarene Critchley, MD Date of Appointment: 10/09/2021 Established Patient Visit  Chief complaint:   Chief Complaint  Patient presents with   Follow-up    Sinus infection in OCT., increased SOB and cough     HPI: Michael Huber is a 30 y.o. 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   Interval Updates: Here for follow up. Last seen Feb 2022. Had sinus congestion in October 2022. Treated with prednisone and antibiotics. X 2 rounds.   Now having chest tightness, cough. Ongoing symptoms are   Prior to this was taking flovent 1 puff twice a day up until getting sick in October.  Was started on flovent 2 puffs twice a day for the last two weeks.  Using albuterol   Current Regimen:  albuterol prn Asthma Triggers: taking flonase, anti-histamine Exacerbations in the last year: 2 in the last month History of hospitalization or intubation: none Allergy Testing: none GERD: still on PPI Allergic Rhinitis: yes still.  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   I have reviewed the patient's family social and past medical history and updated as appropriate.   Past Medical History:  Diagnosis Date   Allergic rhinitis    GERD (gastroesophageal reflux disease)     History reviewed. No pertinent surgical history.  Family History  Problem Relation Age of Onset   Allergic Disorder Mother    Allergic rhinitis Brother    Asthma Neg Hx     Social History   Occupational History   Not on file  Tobacco Use   Smoking status: Never   Smokeless tobacco: Never  Substance and Sexual Activity   Alcohol use: Yes   Drug use: Not Currently   Sexual activity: Not on file     Physical Exam: Blood pressure 132/88, pulse 93, height 6' (1.829 m), weight 241 lb 12.8 oz (109.7 kg),  SpO2 98 %.  Gen:      No acute distress ENT:  no nasal polyps, mucus membranes moist, frequent coughing Lungs:    No increased respiratory effort, symmetric chest wall excursion, clear to auscultation bilaterally, no wheezes or crackles CV:         Regular rate and rhythm; no murmurs, rubs, or gallops.  No pedal edema   Data Reviewed: Imaging: I have previously personally reviewed the chest xray October 2021 which shows no acute cardiopulmonary process.  PFTs:  PFT Results Latest Ref Rng & Units 07/20/2021  FVC-Pre L 6.14  FVC-Predicted Pre % 106  FVC-Post L 5.94  FVC-Predicted Post % 102  Pre FEV1/FVC % % 78  Post FEV1/FCV % % 81  FEV1-Pre L 4.77  FEV1-Predicted Pre % 102  FEV1-Post L 4.78   I have personally reviewed the patient's PFTs and normal pulmonary function.   Labs:   Immunization status: Immunization History  Administered Date(s) Administered   Influenza,inj,Quad PF,6+ Mos 09/26/2020   Moderna Sars-Covid-2 Vaccination 12/29/2019, 01/26/2020, 10/11/2020    Assessment:  Moderate persistent asthma with acute exacerbation and progression.  Allergic rhinitis GERD  Plan/Recommendations: I don't think we need additional prednisone or abx today.  However given symptoms and albuterol use I do think we need to step up his therapy to  Advair 230 2 puffs twice a day.  Will prescribe.  Continue neti pot  first. Continue flonase and singulair. Increase flonase to 1 spray BID. Can switch up claritin with zyrtec.    Return to Care: Return in about 3 months (around 01/09/2022).   Durel Salts, MD Pulmonary and Critical Care Medicine [facility] Office:[phone]

## 2021-10-17 DIAGNOSIS — R0789 Other chest pain: Secondary | ICD-10-CM | POA: Diagnosis not present

## 2021-10-19 ENCOUNTER — Encounter: Payer: Self-pay | Admitting: Internal Medicine

## 2021-10-20 NOTE — Telephone Encounter (Signed)
LMTCB

## 2021-10-21 ENCOUNTER — Ambulatory Visit (INDEPENDENT_AMBULATORY_CARE_PROVIDER_SITE_OTHER): Payer: BC Managed Care – PPO

## 2021-10-21 ENCOUNTER — Ambulatory Visit
Admission: RE | Admit: 2021-10-21 | Discharge: 2021-10-21 | Disposition: A | Discharge status: Home / Self Care | Payer: BC Managed Care – PPO | Source: Ambulatory Visit | Attending: Family Medicine | Admitting: Family Medicine

## 2021-10-21 ENCOUNTER — Other Ambulatory Visit: Payer: Self-pay

## 2021-10-21 VITALS — BP 131/89 | HR 115 | Temp 97.8°F | Resp 18

## 2021-10-21 DIAGNOSIS — R051 Acute cough: Secondary | ICD-10-CM | POA: Diagnosis not present

## 2021-10-21 DIAGNOSIS — S2242XA Multiple fractures of ribs, left side, initial encounter for closed fracture: Secondary | ICD-10-CM | POA: Diagnosis not present

## 2021-10-21 DIAGNOSIS — R059 Cough, unspecified: Secondary | ICD-10-CM

## 2021-10-21 HISTORY — DX: Unspecified asthma, uncomplicated: J45.909

## 2021-10-21 MED ORDER — LEVOFLOXACIN 750 MG PO TABS: 750.0000 mg | ORAL_TABLET | Freq: Every day | ORAL | 0 refills | Status: DC

## 2021-10-21 MED ORDER — HYDROCODONE-ACETAMINOPHEN 5-325 MG PO TABS
1.0000 | ORAL_TABLET | Freq: Four times a day (QID) | ORAL | 0 refills | Status: DC | PRN
Start: 2021-10-21 — End: 2022-08-11

## 2021-10-21 NOTE — Discharge Instructions (Addendum)

## 2021-10-21 NOTE — ED Triage Notes (Addendum)
Patient c/o LFT sided rib pain x 2 days.   Patient endorses "having a cough that lasted a long period of time that has now become better".  Patient endorses breathing shallow due to pain.   Patient endorses " I've had a time when my rib popped after coughing earlier this month".   Patient endorses increased pain with laying.   History of Asthma.

## 2021-10-26 ENCOUNTER — Encounter: Payer: Self-pay | Admitting: Nurse Practitioner

## 2021-10-26 ENCOUNTER — Other Ambulatory Visit: Payer: Self-pay

## 2021-10-26 ENCOUNTER — Ambulatory Visit (INDEPENDENT_AMBULATORY_CARE_PROVIDER_SITE_OTHER): Payer: BC Managed Care – PPO | Admitting: Nurse Practitioner

## 2021-10-26 VITALS — BP 130/100 | HR 104 | Temp 99.1°F | Ht 72.0 in | Wt 243.8 lb

## 2021-10-26 DIAGNOSIS — J4540 Moderate persistent asthma, uncomplicated: Secondary | ICD-10-CM

## 2021-10-26 DIAGNOSIS — S2249XA Multiple fractures of ribs, unspecified side, initial encounter for closed fracture: Secondary | ICD-10-CM | POA: Insufficient documentation

## 2021-10-26 DIAGNOSIS — J301 Allergic rhinitis due to pollen: Secondary | ICD-10-CM

## 2021-10-26 DIAGNOSIS — J309 Allergic rhinitis, unspecified: Secondary | ICD-10-CM | POA: Insufficient documentation

## 2021-10-26 DIAGNOSIS — S2243XD Multiple fractures of ribs, bilateral, subsequent encounter for fracture with routine healing: Secondary | ICD-10-CM | POA: Diagnosis not present

## 2021-10-26 NOTE — Progress Notes (Addendum)
@Patient  ID: Michael Huber, male    DOB: 1991-11-16, 30 y.o.   MRN: [id number]  Chief Complaint  Patient presents with   Follow-up    Referring provider: Manon Hilding, MD  HPI: 30 year old male, never smoker followed for moderate persistent asthma with exercise-induced bronchospasm and allergic rhinitis.  He is a patient of Dr. Mauricio Po and was last seen in office on 10/09/2021.  Past medical history significant for GERD.  TEST/EVENTS:  07/20/2021 PFTs: FVC 6.14 (106), FVC post 5.94 (102), FEV1 pre 4.77 (102), FEV1 post 4.78 (102), FEV1/FVC post 81. No significant BD response. Normal pulmonary function. 10/09/2021 FeNO: 18 ppb  10/21/2021 X ray ribs unilateral with chest: Cardiomediastinal contours and hilar structures normal.  No pneumothorax.  Left lower lobe airspace disease along the left hemidiaphragm from mainly linear in terms of morphologic features without effusion.  Atelectasis most likely, suggest attention for any symptoms that would suggest developing pneumonia.  Fractures of the left fifth and sixth ribs with displacement and signs of mild pleural thickening along the subjacent pleura.  Also with mildly displaced fractures of eighth ninth and 10th ribs on the left.  Associated small amount of extrapleural hematoma may be present along the left chest wall particularly about the level of the fifth and sixth ribs.  Right sided eighth rib with suspected chronic/remote rib fracture.  10/09/2021: OV with Dr. Shearon Stalls.  ACT 14.  Reported 2 exacerbations in the last year with no hospitalization required.  Step up therapy to Advair 232 puffs twice a day.  Continued Nettie pot, Flonase, Singulair.  Changed Claritin to Zyrtec.  10/21/2021: ED visit for rib pain and frequent coughing.  Left ribs and chest x-ray 3 view noted multiple acute left-sided rib fractures without visible pneumothorax, extrapleural hematoma along left chest wall, atelectasis left lower lobe, chronic right-sided eighth rib  fracture.  Treated for possible pneumonia with Levaquin 750.  Discharged home  10/26/2021: Today - hospital follow up Patient presents today for hospital follow up after being evaluated in the ED on 11/23. He was noted to have multiple acute left rib fractures (5th, 6th, 8th, 9th, and 10th), a chronic appearing fracture of the right 8th rib, small extrapleural hematoma on left chest wall, and atelectasis and possible evolving pneumonia in the left lower lobe. Prior to this, he had a reported URI in the beginning of October that led to an asthma exacerbation. He did not require hospitalization and was treated with step up therapy to Advair and management of his postnasal drip/allergy symptoms. He also experienced a frequent, persistent cough during this time frame.   Today, he reports continued pain on his left side that has improved. His breathing and cough have significantly improved since starting his Advair two weeks ago. He denies any increased shortness of breath, worsening cough, fever, chills, or body aches. He has difficulties sleeping at night related to pain, which he takes his Norco rx from the ED for with moderate relief. His pain is dull and constant with worsening with certain movements and coughing. No report of orthopnea, PND, chest pain, or lower leg swelling. He does not have a history of recurrent fractures, prior to these, and denies any recent trauma, falls, or accidents. He denies joint pain/swelling, night sweats, anorexia, fatigue, or recent weight loss. He continues on Advair twice daily, singulair at bedtime, Zyrtec daily, nexium daily, flonase daily, and netti pot as needed. He reports decreased use of his rescue inhaler and no longer requires it daily.  No Known Allergies  Immunization History  Administered Date(s) Administered   Influenza,inj,Quad PF,6+ Mos 09/26/2020, 08/28/2021   Moderna Sars-Covid-2 Vaccination 12/29/2019, 01/26/2020, 10/11/2020    Past Medical  History:  Diagnosis Date   Allergic rhinitis    Asthma    GERD (gastroesophageal reflux disease)     Tobacco History: Social History   Tobacco Use  Smoking Status Never  Smokeless Tobacco Never   Counseling given: Not Answered   Outpatient Medications Prior to Visit  Medication Sig Dispense Refill   albuterol (VENTOLIN HFA) 108 (90 Base) MCG/ACT inhaler Inhale 2 puffs into the lungs 4 (four) times daily.     esomeprazole (NEXIUM) 40 MG capsule Take by mouth.     FLUoxetine (PROZAC) 20 MG capsule Take 1 capsule by mouth daily.     fluticasone (FLONASE) 50 MCG/ACT nasal spray Place 2 sprays into both nostrils daily. 16 g 0   fluticasone-salmeterol (ADVAIR HFA) 230-21 MCG/ACT inhaler Inhale 2 puffs into the lungs 2 (two) times daily. 1 each 12   HYDROcodone-acetaminophen (NORCO/VICODIN) 5-325 MG tablet Take 1 tablet by mouth every 6 (six) hours as needed for moderate pain or severe pain. 20 tablet 0   levofloxacin (LEVAQUIN) 750 MG tablet Take 1 tablet (750 mg total) by mouth daily. 7 tablet 0   methylphenidate 36 MG PO CR tablet Take 1 tablet by mouth daily.     montelukast (SINGULAIR) 10 MG tablet Take 10 mg by mouth at bedtime.     Spacer/Aero-Holding Chambers (AEROCHAMBER PLUS) inhaler Use with inhaler 1 each 2   ibuprofen (ADVIL) 600 MG tablet Take 1 tablet (600 mg total) by mouth every 6 (six) hours as needed. (Patient not taking: Reported on 10/26/2021) 30 tablet 0   No facility-administered medications prior to visit.     Review of Systems:   Constitutional: No weight loss or gain, night sweats, fevers, chills, fatigue, or lassitude. HEENT: No headaches, difficulty swallowing, tooth/dental problems, or sore throat. No sneezing, itching, ear ache, nasal congestion. +post nasal drip (improving) CV:  No chest pain, orthopnea, PND, swelling in lower extremities, anasarca, dizziness, palpitations, syncope Resp: +intermittent shortness of breath with exertion (improving).  +Intermittent non productive cough (improving) No excess mucus or change in color of mucus. No hemoptysis. No wheezing.  No chest wall deformity GI:  No heartburn, indigestion, abdominal pain, nausea, vomiting, diarrhea, change in bowel habits, loss of appetite, bloody stools.  GU: No dysuria, change in color of urine, urgency or frequency.  No flank pain, no hematuria  Skin: No rash, lesions, ulcerations MSK:  +Left lateral chest wall pain (multiple rib fx present). No joint pain or swelling.  No decreased range of motion.  No back pain. Neuro: No dizziness or lightheadedness.  Psych: No depression or anxiety. Mood stable.     Physical Exam:  BP (!) 130/100 (BP Location: Right Arm, Patient Position: Sitting, Cuff Size: Large)   Pulse (!) 104   Temp 99.1 F (37.3 C) (Oral)   Ht 6' (1.829 m)   Wt 243 lb 12.8 oz (110.6 kg)   SpO2 100%   BMI 33.07 kg/m   GEN: Pleasant, interactive, well-nourished; in no acute distress. HEENT:  Normocephalic and atraumatic. EACs patent bilaterally. TM pearly gray with present light reflex bilaterally. PERRLA. Sclera white. Nasal turbinates pink, moist and patent bilaterally. No rhinorrhea present. Oropharynx pink and moist, without exudate or edema. No lesions, ulcerations, or postnasal drip.  NECK:  Supple w/ fair ROM. No JVD present. Normal carotid impulses  w/o bruits. Thyroid symmetrical with no goiter or nodules palpated. No lymphadenopathy.   CV: RRR, no m/r/g, no peripheral edema. Pulses intact, +2 bilaterally. No cyanosis, pallor or clubbing. PULMONARY:  Unlabored, regular breathing. Diminished breath sounds left lower lobe w/o wheezes/rales/rhonchi otherwise clear bilaterally A&P. No accessory muscle use. No dullness to percussion. GI: BS present and normoactive. Soft, non-tender to palpation. No organomegaly or masses detected. No CVA tenderness. MSK: Tenderness to left lateral chest wall upon palpation. No erythema, warmth, or ecchymosis. Cap refil  <2 sec all extrem. No deformities or joint swelling noted.  Neuro: A/Ox3. No focal deficits noted.   Skin: Warm, no lesions or rashe Psych: Normal affect and behavior. Judgement and thought content appropriate.     Lab Results:  CBC    Component Value Date/Time   WBC 9.8 10/26/2021 1647   RBC 5.20 10/26/2021 1647   HGB 14.6 10/26/2021 1647   HCT 43.6 10/26/2021 1647   PLT 421.0 (H) 10/26/2021 1647   MCV 83.9 10/26/2021 1647   MCHC 33.4 10/26/2021 1647   RDW 13.5 10/26/2021 1647   LYMPHSABS 3.1 10/26/2021 1647   MONOABS 0.8 10/26/2021 1647   EOSABS 0.2 10/26/2021 1647   BASOSABS 0.1 10/26/2021 1647    BMET    Component Value Date/Time   NA 139 10/26/2021 1647   K 4.3 10/26/2021 1647   CL 104 10/26/2021 1647   CO2 27 10/26/2021 1647   GLUCOSE 83 10/26/2021 1647   BUN 12 10/26/2021 1647   CREATININE 0.95 10/26/2021 1647   CALCIUM 9.9 10/26/2021 1647    BNP No results found for: BNP   Imaging:  DG Ribs Unilateral W/Chest Left  Result Date: 10/21/2021 CLINICAL DATA:  Rib pain in a 30 year old male. Frequent coughing by report. No reported history of trauma. EXAM: LEFT RIBS AND CHEST - 3+ VIEW COMPARISON:  September 28, 2020. FINDINGS: Trachea midline. Cardiomediastinal contours and hilar structures are normal. No pneumothorax. LEFT lower lobe airspace disease along the LEFT hemidiaphragm mainly linear in terms of morphologic features. No gross effusion. Fractures of LEFT fifth and sixth ribs with displacement and signs of mild pleural thickening along the subjacent pleura. Also with mildly displaced fractures of eighth, ninth and tenth ribs on the LEFT. These appear acute. RIGHT-sided eighth rib with suspected chronic/remote rib fracture. These may have occurred since previous imaging from 2021. IMPRESSION: Multiple acute LEFT-sided rib fractures without visible pneumothorax. Given the multiplicity and displacement of rib fractures would suggest correlation with any history  of trauma or underlying condition that could predispose to atraumatic rib fractures. Associated small amount of extrapleural hematoma may be present along the LEFT chest wall particularly about the level of fifth and sixth ribs. LEFT lower lobe airspace disease, atelectasis most likely given configuration. Suggest attention for any symptoms that would suggest developing pneumonia in the setting of multiple LEFT-sided rib fractures. RIGHT-sided eighth rib with suspected chronic/remote rib fracture. These results will be called to the ordering clinician or representative by the Radiologist Assistant, and communication documented in the PACS or [employer]. Electronically Signed   By: Donzetta Kohut M.D.   On: 10/21/2021 14:55      PFT Results Latest Ref Rng & Units 07/20/2021  FVC-Pre L 6.14  FVC-Predicted Pre % 106  FVC-Post L 5.94  FVC-Predicted Post % 102  Pre FEV1/FVC % % 78  Post FEV1/FCV % % 81  FEV1-Pre L 4.77  FEV1-Predicted Pre % 102  FEV1-Post L 4.78    No results  found for: NITRICOXIDE      Assessment & Plan:   Multiple rib fractures involving four or more ribs Possible stress fractures related to persistent cough. Concerned for underlying pathological cause given number of fractures without any significant injury/trauma. CBC with diff, CMET, and alkaline phos today WNL aside from mild elevation in plt count to 421, likely related to recent injuries. Will discuss with Dr. Shearon Stalls regarding necessity of further imaging/workup.   Patient Instructions  Continue Advair 230 2 puffs twice daily Continue albuterol inhaler 2 puffs every 6 hours as needed for shortness of breath and wheezing Continue Singulair 10 mg At bedtime  Continue Nexium 40 mg daily for reflux Continue Flonase nasal spray 2 sprays into each nostril daily Complete antibiotic course of levaquin 750 mg daily as previously prescribed  Continue Norco 5-325 mg every 6 hours as needed for moderate to severe pain  per ED rx. Do not exceed 4000 mg of acetaminophen (tylenol) in 24 hour period.  Naproxen over the counter as directed for pain.  Incentive spirometer 10 times an hour while awake.  Activity, as tolerated.   Will obtain chest x ray at next visit for eval of pneumonia resolution and rib fractures.   Labs today include CBC with differential, comprehensive metabolic panel, and alkaline phosphate. May consider further imaging.   Follow up in two weeks with Dr. Shearon Stalls, Roxan Diesel, NP or APP. If symptoms do not improve or worsen, please contact office for sooner follow up or seek emergency care.    Moderate persistent asthma Significant improvement on Advair. Rarely requiring rescue inhaler. See above plan.  Allergic rhinitis Symptoms improving. See above plan.     Clayton Bibles, NP 10/27/2021  Pt aware and understands NP's role.

## 2021-10-26 NOTE — Assessment & Plan Note (Signed)
Significant improvement on Advair. Rarely requiring rescue inhaler. See above plan.

## 2021-10-26 NOTE — Patient Instructions (Addendum)
Continue Advair 230 2 puffs twice daily Continue albuterol inhaler 2 puffs every 6 hours as needed for shortness of breath and wheezing Continue Singulair 10 mg At bedtime  Continue Nexium 40 mg daily for reflux Continue Flonase nasal spray 2 sprays into each nostril daily Complete antibiotic course of levaquin 750 mg daily as previously prescribed  Continue Norco 5-325 mg every 6 hours as needed for moderate to severe pain per ED rx. Do not exceed 4000 mg of acetaminophen (tylenol) in 24 hour period.  Naproxen over the counter as directed for pain.  Incentive spirometer 10 times an hour while awake.  Activity, as tolerated.   Will obtain chest x ray at next visit for eval of pneumonia resolution and rib fractures.   Labs today include CBC with differential, comprehensive metabolic panel, and alkaline phosphate. May consider further imaging.   Follow up in two weeks with Dr. Celine Mans, Rhunette Croft, NP or APP. If symptoms do not improve or worsen, please contact office for sooner follow up or seek emergency care.

## 2021-10-26 NOTE — Assessment & Plan Note (Signed)
Symptoms improving. See above plan.

## 2021-10-26 NOTE — Assessment & Plan Note (Addendum)
Possible stress fractures related to persistent cough. Concerned for underlying pathological cause given number of fractures without any significant injury/trauma. CBC with diff, CMET, and alkaline phos today WNL aside from mild elevation in plt count to 421, likely related to recent injuries. Will discuss with Dr. Celine Mans regarding necessity of further imaging/workup.   Patient Instructions  Continue Advair 230 2 puffs twice daily Continue albuterol inhaler 2 puffs every 6 hours as needed for shortness of breath and wheezing Continue Singulair 10 mg At bedtime  Continue Nexium 40 mg daily for reflux Continue Flonase nasal spray 2 sprays into each nostril daily Complete antibiotic course of levaquin 750 mg daily as previously prescribed  Continue Norco 5-325 mg every 6 hours as needed for moderate to severe pain per ED rx. Do not exceed 4000 mg of acetaminophen (tylenol) in 24 hour period.  Naproxen over the counter as directed for pain.  Incentive spirometer 10 times an hour while awake.  Activity, as tolerated.   Will obtain chest x ray at next visit for eval of pneumonia resolution and rib fractures.   Labs today include CBC with differential, comprehensive metabolic panel, and alkaline phosphate. May consider further imaging.   Follow up in two weeks with Dr. Celine Mans, Rhunette Croft, NP or APP. If symptoms do not improve or worsen, please contact office for sooner follow up or seek emergency care.

## 2021-10-27 LAB — CBC WITH DIFFERENTIAL/PLATELET
Basophils Absolute: 0.1 10*3/uL (ref 0.0–0.1)
Basophils Relative: 0.7 % (ref 0.0–3.0)
Eosinophils Absolute: 0.2 10*3/uL (ref 0.0–0.7)
Eosinophils Relative: 1.9 % (ref 0.0–5.0)
HCT: 43.6 % (ref 39.0–52.0)
Hemoglobin: 14.6 g/dL (ref 13.0–17.0)
Lymphocytes Relative: 31.1 % (ref 12.0–46.0)
Lymphs Abs: 3.1 10*3/uL (ref 0.7–4.0)
MCHC: 33.4 g/dL (ref 30.0–36.0)
MCV: 83.9 fl (ref 78.0–100.0)
Monocytes Absolute: 0.8 10*3/uL (ref 0.1–1.0)
Monocytes Relative: 8.6 % (ref 3.0–12.0)
Neutro Abs: 5.7 10*3/uL (ref 1.4–7.7)
Neutrophils Relative %: 57.7 % (ref 43.0–77.0)
Platelets: 421 10*3/uL — ABNORMAL HIGH (ref 150.0–400.0)
RBC: 5.2 Mil/uL (ref 4.22–5.81)
RDW: 13.5 % (ref 11.5–15.5)
WBC: 9.8 10*3/uL (ref 4.0–10.5)

## 2021-10-27 LAB — COMPREHENSIVE METABOLIC PANEL
ALT: 41 U/L (ref 0–53)
AST: 23 U/L (ref 0–37)
Albumin: 4.6 g/dL (ref 3.5–5.2)
Alkaline Phosphatase: 77 U/L (ref 39–117)
BUN: 12 mg/dL (ref 6–23)
CO2: 27 mEq/L (ref 19–32)
Calcium: 9.9 mg/dL (ref 8.4–10.5)
Chloride: 104 mEq/L (ref 96–112)
Creatinine, Ser: 0.95 mg/dL (ref 0.40–1.50)
GFR: 105.73 mL/min (ref >60.00)
Glucose, Bld: 83 mg/dL (ref 70–99)
Potassium: 4.3 mEq/L (ref 3.5–5.1)
Sodium: 139 mEq/L (ref 135–145)
Total Bilirubin: 0.3 mg/dL (ref 0.2–1.2)
Total Protein: 7.6 g/dL (ref 6.0–8.3)

## 2021-10-27 LAB — ALKALINE PHOSPHATASE: Alkaline Phosphatase: 77 U/L (ref 39–117)

## 2021-10-27 NOTE — ED Provider Notes (Signed)
[facility]   [id number] 10/21/21 Arrival Time: 1341  ASSESSMENT & PLAN:  1. Closed fracture of multiple ribs of left side, initial encounter   2. Acute cough    No resp distress. Pain management. See radiology report. Will tx empirically for PNA.  Meds ordered this encounter  Medications   HYDROcodone-acetaminophen (NORCO/VICODIN) 5-325 MG tablet    Sig: Take 1 tablet by mouth every 6 (six) hours as needed for moderate pain or severe pain.    Dispense:  20 tablet    Refill:  0   levofloxacin (LEVAQUIN) 750 MG tablet    Sig: Take 1 tablet (750 mg total) by mouth daily.    Dispense:  7 tablet    Refill:  0    Follow-up Information     [facility] EMERGENCY DEPARTMENT.   Specialty: Emergency Medicine Why: If symptoms worsen in any way. Contact information: [address] [id number] mc [place] [address] [phone]               Reviewed expectations re: course of current medical issues. Questions answered. Outlined signs and symptoms indicating need for more acute intervention. Patient verbalized understanding. After Visit Summary given.   SUBJECTIVE: History from: patient.  Michael Huber is a 30 y.o. male who reports L sided chest wall pain; reports significant coughing past week; "coughed yesterday and felt something pop in my left side". Immediate pain. No SOB reported. Afebrile. Now pain is worsening; increased with certain movements. OTC analgesics without relief.  Social History   Tobacco Use  Smoking Status Never  Smokeless Tobacco Never    OBJECTIVE:  Vitals:   10/21/21 1418  BP: 131/89  Pulse: (!) 115  Resp: 18  Temp: 97.8 F (36.6 C)  TempSrc: Oral  SpO2: 94%    Tachycardia noted.  General appearance: alert; appears to be in pain HEENT: nasal congestion; clear runny nose; throat irritation secondary to post-nasal drainage Lungs: unlabored respirations, symmetrical air entry without  wheezing; clear Chest wall: very TTP over L mid-axillary chest; no bruising or chest wall abnormalities Abd: soft Ext: no LE edema Skin: warm and dry Psychological: alert and cooperative; normal mood and affect  Imaging: DG Ribs Unilateral W/Chest Left  Result Date: 10/21/2021 CLINICAL DATA:  Rib pain in a 30 year old male. Frequent coughing by report. No reported history of trauma. EXAM: LEFT RIBS AND CHEST - 3+ VIEW COMPARISON:  September 28, 2020. FINDINGS: Trachea midline. Cardiomediastinal contours and hilar structures are normal. No pneumothorax. LEFT lower lobe airspace disease along the LEFT hemidiaphragm mainly linear in terms of morphologic features. No gross effusion. Fractures of LEFT fifth and sixth ribs with displacement and signs of mild pleural thickening along the subjacent pleura. Also with mildly displaced fractures of eighth, ninth and tenth ribs on the LEFT. These appear acute. RIGHT-sided eighth rib with suspected chronic/remote rib fracture. These may have occurred since previous imaging from 2021. IMPRESSION: Multiple acute LEFT-sided rib fractures without visible pneumothorax. Given the multiplicity and displacement of rib fractures would suggest correlation with any history of trauma or underlying condition that could predispose to atraumatic rib fractures. Associated small amount of extrapleural hematoma may be present along the LEFT chest wall particularly about the level of fifth and sixth ribs. LEFT lower lobe airspace disease, atelectasis most likely given configuration. Suggest attention for any symptoms that would suggest developing pneumonia in the setting of multiple LEFT-sided rib fractures. RIGHT-sided eighth rib with suspected chronic/remote rib fracture. These results will  be called to the ordering clinician or representative by the Radiologist Assistant, and communication documented in the PACS or [employer]. Electronically Signed   By: Donzetta Kohut M.D.   On:  10/21/2021 14:55    No Known Allergies  Past Medical History:  Diagnosis Date   Allergic rhinitis    Asthma    GERD (gastroesophageal reflux disease)    Family History  Problem Relation Age of Onset   Allergic Disorder Mother    Allergic rhinitis Brother    Asthma Neg Hx    Social History   Socioeconomic History   Marital status: Married    Spouse name: Not on file   Number of children: Not on file   Years of education: Not on file   Highest education level: Not on file  Occupational History   Not on file  Tobacco Use   Smoking status: Never   Smokeless tobacco: Never  Vaping Use   Vaping Use: Never used  Substance and Sexual Activity   Alcohol use: Yes   Drug use: Not Currently   Sexual activity: Not on file  Other Topics Concern   Not on file  Social History Narrative   Not on file   Social Determinants of Health   Financial Resource Strain: Not on file  Food Insecurity: Not on file  Transportation Needs: Not on file  Physical Activity: Not on file  Stress: Not on file  Social Connections: Not on file  Intimate Partner Violence: Not on file            Mardella Layman, MD 10/27/21 1237

## 2021-11-09 ENCOUNTER — Ambulatory Visit (INDEPENDENT_AMBULATORY_CARE_PROVIDER_SITE_OTHER): Payer: BC Managed Care – PPO | Admitting: Internal Medicine

## 2021-11-09 ENCOUNTER — Encounter: Payer: Self-pay | Admitting: Internal Medicine

## 2021-11-09 ENCOUNTER — Other Ambulatory Visit: Payer: Self-pay

## 2021-11-09 VITALS — BP 128/80 | HR 102 | Resp 17 | Ht 72.0 in | Wt 243.0 lb

## 2021-11-09 DIAGNOSIS — J4540 Moderate persistent asthma, uncomplicated: Secondary | ICD-10-CM

## 2021-11-09 NOTE — Progress Notes (Signed)
[name]    [id number]    September 26, 1991  Primary Care Physician:Sasser, Clarene Critchley, MD Date of Appointment: 11/09/2021 Established Patient Visit  Chief complaint:   Chief Complaint  Patient presents with   Asthma     HPI: Michael Huber is a 30 y.o. man with moderate persistent asthma.  Interval Updates: Improved asthma control. Still has rib pain but it is much improved. Norco didn't help much.   Minimal albuterol use since starting advair.   Current Regimen:  advair 230 2 puffs twice a day. albuterol prn Asthma Triggers: taking flonase, anti-histamine Exacerbations in the last year: 2 in the last month History of hospitalization or intubation: none Allergy Testing: none GERD: still on PPI Allergic Rhinitis: yes still.  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   I have reviewed the patient's family social and past medical history and updated as appropriate.   Past Medical History:  Diagnosis Date   Allergic rhinitis    Asthma    GERD (gastroesophageal reflux disease)     History reviewed. No pertinent surgical history.  Family History  Problem Relation Age of Onset   Allergic Disorder Mother    Allergic rhinitis Brother    Asthma Neg Hx     Social History   Occupational History   Not on file  Tobacco Use   Smoking status: Never   Smokeless tobacco: Never  Vaping Use   Vaping Use: Never used  Substance and Sexual Activity   Alcohol use: Yes   Drug use: Not Currently   Sexual activity: Not on file     Physical Exam: Blood pressure 128/80, pulse (!) 102, resp. rate 17, height 6' (1.829 m), weight 243 lb (110.2 kg), SpO2 97 %.  Gen:      No acute distress ENT: no thrush, mmm Lungs:    ctab no wheezes or crackles CV:         Regular rate and rhythm; no murmurs, rubs, or gallops.  No pedal edema MSK:      acute paraspinal muscle spasm on  right side   Data Reviewed: Imaging: I have previously personally reviewed the chest xray October 2021 which shows no acute cardiopulmonary process.  PFTs: No airflow limitation PFT Results Latest Ref Rng & Units 07/20/2021  FVC-Pre L 6.14  FVC-Predicted Pre % 106  FVC-Post L 5.94  FVC-Predicted Post % 102  Pre FEV1/FVC % % 78  Post FEV1/FCV % % 81  FEV1-Pre L 4.77  FEV1-Predicted Pre % 102  FEV1-Post L 4.78   I have personally reviewed the patient's PFTs and normal pulmonary function.   Labs: CMP, CBC and AP reviewed - wnl  Immunization status: Immunization History  Administered Date(s) Administered   Influenza,inj,Quad PF,6+ Mos 09/26/2020, 08/28/2021   Moderna Sars-Covid-2 Vaccination 12/29/2019, 01/26/2020, 10/11/2020    Assessment:  Moderate persistent asthma. Improved control Allergic rhinitis, improved GERD controlled Recent rib fractures, improving  Plan/Recommendations: Continue advair 230 2 puffs twice a day. Continue prn albuterol.  continue neti pot  first withflonase and singulair. Continue zyrtec.  Continue PPI Labs reviewed from last visit - I do not think these are pathologic rib fractures, more likely related to severe coughing. Labs reassuring. No further follow up needed.   Heat and massage for muscle spasm.    Return to Care: Return in  about 4 months (around 03/10/2022).   Durel Salts, MD Pulmonary and Critical Care Medicine [facility] Office:[phone]

## 2021-11-09 NOTE — Patient Instructions (Addendum)
Please schedule follow up scheduled with myself in 4 months.  If my schedule is not open yet, we will contact you with a reminder closer to that time. Please call [phone] if you haven't heard from [place] a month before.   Continue your advair 2 puffs twice a day.

## 2021-12-21 DIAGNOSIS — F411 Generalized anxiety disorder: Secondary | ICD-10-CM | POA: Diagnosis not present

## 2021-12-21 DIAGNOSIS — J4530 Mild persistent asthma, uncomplicated: Secondary | ICD-10-CM | POA: Diagnosis not present

## 2021-12-21 DIAGNOSIS — F900 Attention-deficit hyperactivity disorder, predominantly inattentive type: Secondary | ICD-10-CM | POA: Diagnosis not present

## 2021-12-21 DIAGNOSIS — K219 Gastro-esophageal reflux disease without esophagitis: Secondary | ICD-10-CM | POA: Diagnosis not present

## 2021-12-30 ENCOUNTER — Ambulatory Visit: Payer: BC Managed Care – PPO | Admitting: Internal Medicine

## 2022-02-02 DIAGNOSIS — D225 Melanocytic nevi of trunk: Secondary | ICD-10-CM | POA: Diagnosis not present

## 2022-03-12 IMAGING — DX DG RIBS W/ CHEST 3+V*L*
5 series · 5 of 5 positions shown · non-contrast
Comparison: September 28, 2020.

CLINICAL DATA: Rib pain in a 30-year-old male. Frequent coughing by
report. No reported history of trauma.

EXAM:
LEFT RIBS AND CHEST - 3+ VIEW

[chest pa]
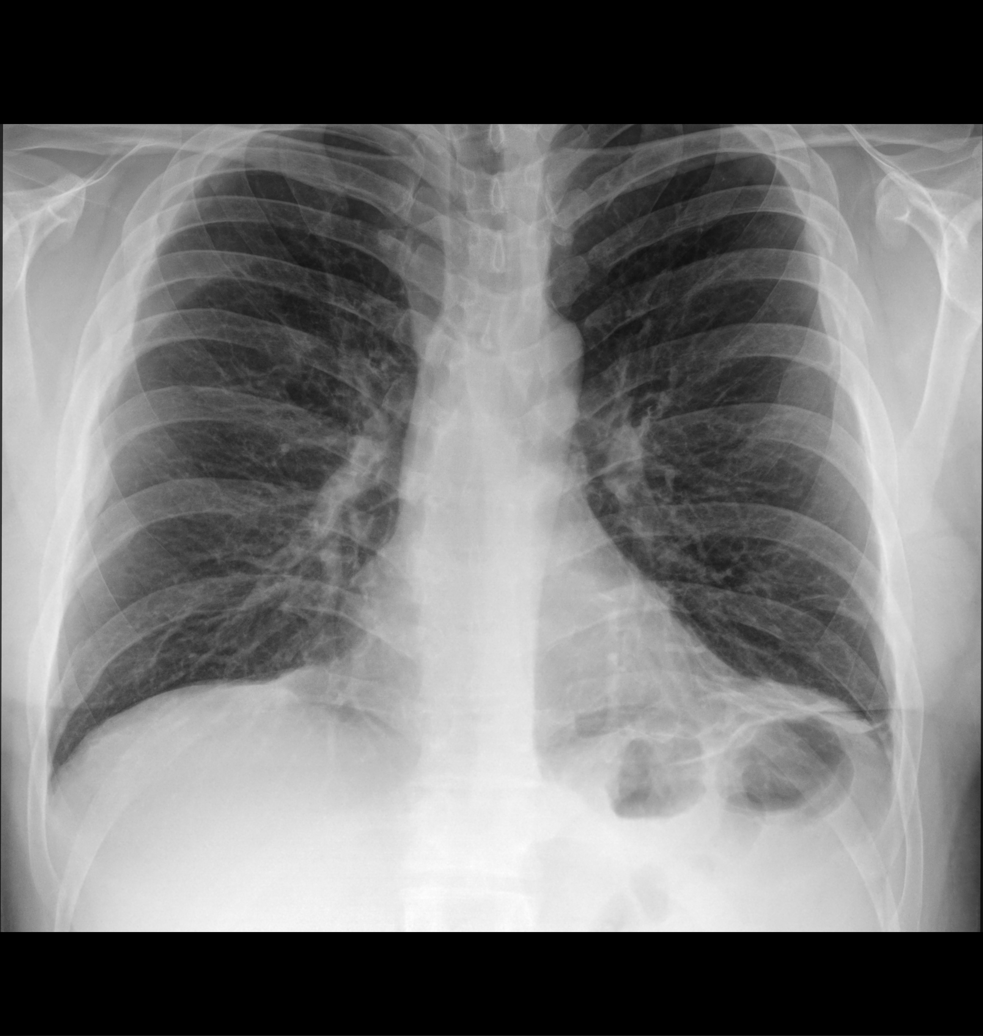

[hemithorax (ribs) ap (1 of 2)]
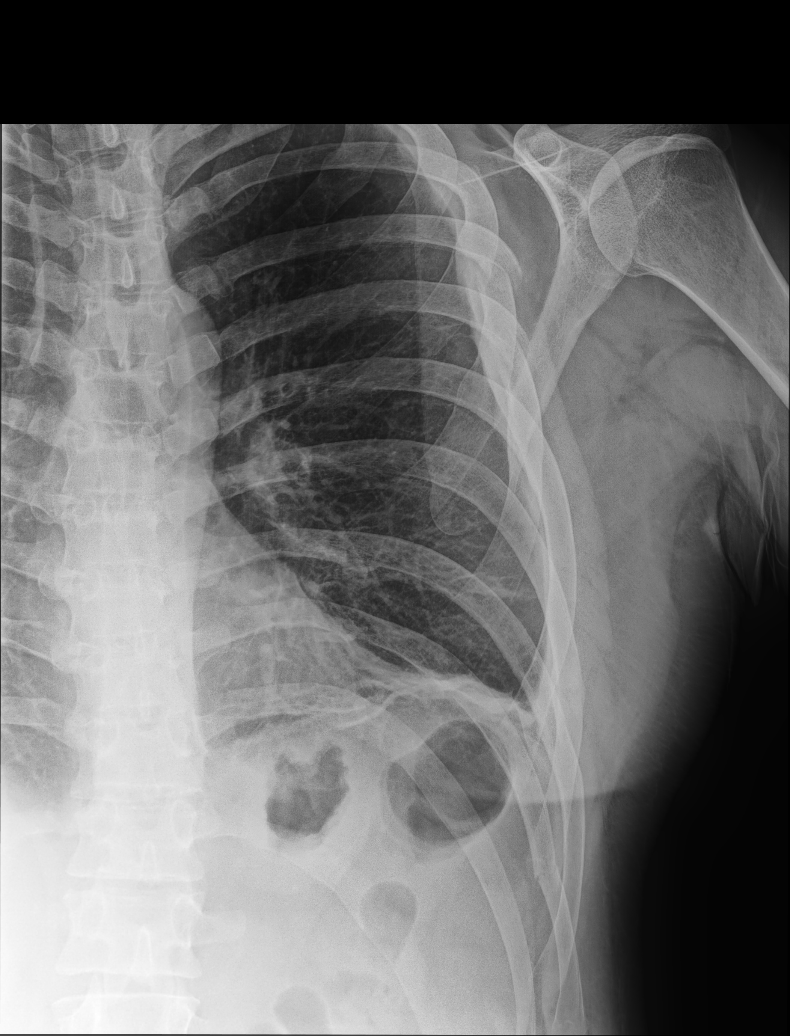

[hemithorax (ribs) mlo (1 of 2)]
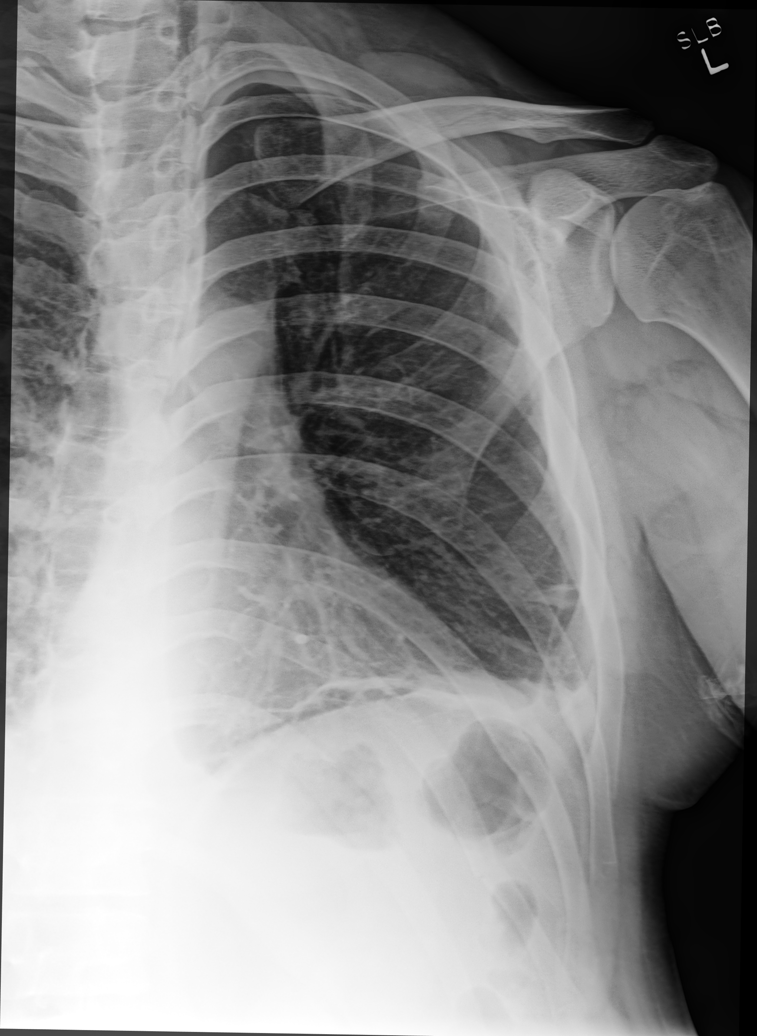

[hemithorax (ribs) ap (2 of 2)]
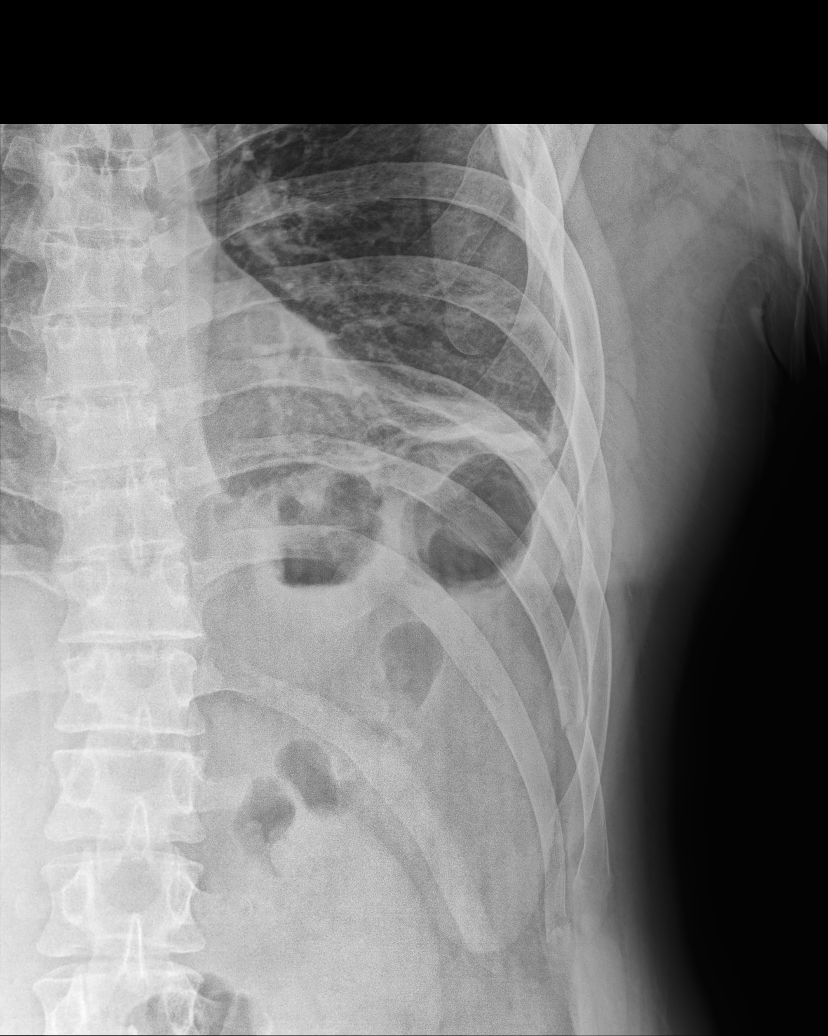

[hemithorax (ribs) mlo (2 of 2)]
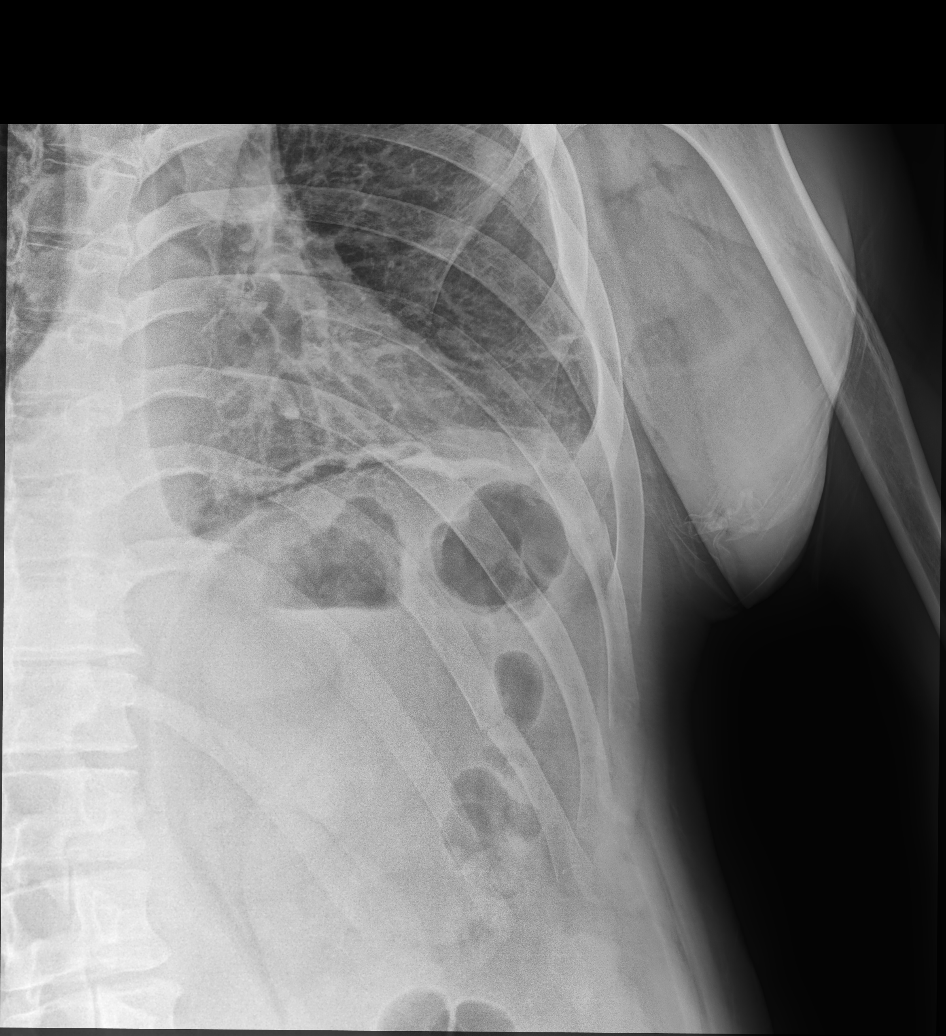

[5 of 5 positions shown; findings below may reference images not displayed]

FINDINGS: Trachea midline.

Cardiomediastinal contours and hilar structures are normal. No
pneumothorax.

LEFT lower lobe airspace disease along the LEFT hemidiaphragm mainly
linear in terms of morphologic features. No gross effusion.

Fractures of LEFT fifth and sixth ribs with displacement and signs
of mild pleural thickening along the subjacent pleura. Also with
mildly displaced fractures of eighth, ninth and tenth ribs on the
LEFT.

These appear acute.

RIGHT-sided eighth rib with suspected chronic/remote rib fracture.
These may have occurred since previous imaging from 7773.
IMPRESSION: Multiple acute LEFT-sided rib fractures without visible
pneumothorax. Given the multiplicity and displacement of rib
fractures would suggest correlation with any history of trauma or
underlying condition that could predispose to atraumatic rib
fractures.

Associated small amount of extrapleural hematoma may be present
along the LEFT chest wall particularly about the level of fifth and
sixth ribs.

LEFT lower lobe airspace disease, atelectasis most likely given
configuration. Suggest attention for any symptoms that would suggest
developing pneumonia in the setting of multiple LEFT-sided rib
fractures.

RIGHT-sided eighth rib with suspected chronic/remote rib fracture.

These results will be called to the ordering clinician or
representative by the Radiologist Assistant, and communication
documented in the PACS or [REDACTED].

## 2022-03-26 DIAGNOSIS — F411 Generalized anxiety disorder: Secondary | ICD-10-CM | POA: Diagnosis not present

## 2022-03-26 DIAGNOSIS — J4530 Mild persistent asthma, uncomplicated: Secondary | ICD-10-CM | POA: Diagnosis not present

## 2022-03-26 DIAGNOSIS — F900 Attention-deficit hyperactivity disorder, predominantly inattentive type: Secondary | ICD-10-CM | POA: Diagnosis not present

## 2022-03-26 DIAGNOSIS — K219 Gastro-esophageal reflux disease without esophagitis: Secondary | ICD-10-CM | POA: Diagnosis not present

## 2022-04-15 DIAGNOSIS — D3131 Benign neoplasm of right choroid: Secondary | ICD-10-CM | POA: Diagnosis not present

## 2022-06-02 DIAGNOSIS — K219 Gastro-esophageal reflux disease without esophagitis: Secondary | ICD-10-CM | POA: Diagnosis not present

## 2022-06-02 DIAGNOSIS — Z1331 Encounter for screening for depression: Secondary | ICD-10-CM | POA: Diagnosis not present

## 2022-06-02 DIAGNOSIS — F411 Generalized anxiety disorder: Secondary | ICD-10-CM | POA: Diagnosis not present

## 2022-06-02 DIAGNOSIS — J4530 Mild persistent asthma, uncomplicated: Secondary | ICD-10-CM | POA: Diagnosis not present

## 2022-06-02 DIAGNOSIS — F900 Attention-deficit hyperactivity disorder, predominantly inattentive type: Secondary | ICD-10-CM | POA: Diagnosis not present

## 2022-06-02 DIAGNOSIS — Z1389 Encounter for screening for other disorder: Secondary | ICD-10-CM | POA: Diagnosis not present

## 2022-06-30 ENCOUNTER — Ambulatory Visit: Payer: BC Managed Care – PPO | Admitting: Internal Medicine

## 2022-07-22 ENCOUNTER — Ambulatory Visit: Payer: BC Managed Care – PPO | Admitting: Internal Medicine

## 2022-08-09 ENCOUNTER — Ambulatory Visit: Payer: BC Managed Care – PPO | Admitting: Internal Medicine

## 2022-08-11 ENCOUNTER — Ambulatory Visit (INDEPENDENT_AMBULATORY_CARE_PROVIDER_SITE_OTHER): Payer: BC Managed Care – PPO | Admitting: Internal Medicine

## 2022-08-11 ENCOUNTER — Encounter: Payer: Self-pay | Admitting: Internal Medicine

## 2022-08-11 VITALS — BP 138/80 | HR 82 | Temp 98.1°F | Ht 72.0 in | Wt 242.8 lb

## 2022-08-11 DIAGNOSIS — J301 Allergic rhinitis due to pollen: Secondary | ICD-10-CM

## 2022-08-11 DIAGNOSIS — J4540 Moderate persistent asthma, uncomplicated: Secondary | ICD-10-CM

## 2022-08-11 MED ORDER — FLUTICASONE-SALMETEROL 230-21 MCG/ACT IN AERO: 2.0000 | INHALATION_SPRAY | Freq: Two times a day (BID) | RESPIRATORY_TRACT | 12 refills | Status: DC

## 2022-08-11 MED ORDER — MONTELUKAST SODIUM 10 MG PO TABS: 10.0000 mg | ORAL_TABLET | Freq: Every day | ORAL | 11 refills | Status: AC

## 2022-08-11 NOTE — Patient Instructions (Addendum)
Please schedule follow up scheduled with myself in 6 months.  If my schedule is not open yet, we will contact you with a reminder closer to that time. Please call [phone] if you haven't heard from [place] a month before.   Stop for a blood test for allergies on your way out today.  I will contact you with results on mychart.  Continue albuterol inhaler as needed.  Continue advair 2 puffs twice a day.  Once you've been well controlled (and without flares for a year,) we can talk about stepping down therapy.  You can continue cetirizine and flonase for your allergies with singulair. You can take flonase twice daily on days where symptoms are bothersome. You can also add nasal saline spray as needed to help flush out pollen/allergens.

## 2022-08-11 NOTE — Progress Notes (Signed)
Michael Huber    [id number]    04-Nov-1991  Primary Care Physician:Sasser, Clarene Critchley, MD Date of Appointment: 08/11/2022 Established Patient Visit  Chief complaint:   Chief Complaint  Patient presents with   Follow-up    Sinus Infection 2 wks. Doing better, PND, no sob     HPI: Michael Huber is a 31 y.o. man with moderate persistent asthma.  Interval Updates: Here for follow up for asthma. Last seen 9 months ago.   Still taking advair. No adverse effects.  No interval hospitalizations or ED visits. Feels it is not affecting his quality of of life.  Usually flares with allergy symptoms.    Current Regimen:  advair 230 2 puffs twice a day. albuterol prn Asthma Triggers: taking flonase, allergies Exacerbations in the last year: 0 in the last few months since starting advair.  History of hospitalization or intubation: none Allergy Testing: none GERD: still on PPI Allergic Rhinitis: yes still. 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   I have reviewed the patient's family social and past medical history and updated as appropriate.   Past Medical History:  Diagnosis Date   Allergic rhinitis    Asthma    GERD (gastroesophageal reflux disease)     History reviewed. No pertinent surgical history.  Family History  Problem Relation Age of Onset   Allergic Disorder Mother    Allergic rhinitis Brother    Asthma Neg Hx     Social History   Occupational History   Not on file  Tobacco Use   Smoking status: Never   Smokeless tobacco: Never  Vaping Use   Vaping Use: Never used  Substance and Sexual Activity   Alcohol use: Yes   Drug use: Not Currently   Sexual activity: Not on file     Physical Exam: Blood pressure 138/80, pulse 82, temperature 98.1 F (36.7 C), temperature source  Temporal, height 6' (1.829 m), weight 242 lb 12.8 oz (110.1 kg), SpO2 98 %.  Gen:      No acute distress ENT: no thrush, mmm Lungs:    ctab no wheezes or crackles CV:         Regular rate and rhythm; no murmurs, rubs, or gallops.  No pedal edema    Data Reviewed: Imaging: I have previously personally reviewed the chest xray October 2021 which shows no acute cardiopulmonary process.  PFTs: No airflow limitation    Latest Ref Rng & Units 07/20/2021    3:54 PM  PFT Results  FVC-Pre L 6.14   FVC-Predicted Pre % 106   FVC-Post L 5.94   FVC-Predicted Post % 102   Pre FEV1/FVC % % 78   Post FEV1/FCV % % 81   FEV1-Pre L 4.77   FEV1-Predicted Pre % 102   FEV1-Post L 4.78    I have personally reviewed the patient's PFTs and normal pulmonary function.   Labs: CMP, CBC and AP reviewed - wnl  Immunization status: Immunization History  Administered Date(s) Administered   Influenza,inj,Quad PF,6+ Mos 09/26/2020, 08/28/2021   Moderna Sars-Covid-2 Vaccination 12/29/2019, 01/26/2020, 10/11/2020    Assessment:  Moderate persistent asthma. Improved control Allergic rhinitis, persistent not improved.  GERD controlled  Plan/Recommendations: continue advair 230 2 puffs twice a  day. Continue prn albuterol.  continue zyrtec with flonase and singulair. Add nasal saline spray Will check region 2 allergy panel. Consider allergy referral pending results.  Continue PPI   Return to Care: Return in about 6 months (around 02/09/2023).   Durel Salts, MD Pulmonary and Critical Care Medicine [facility] Office:[phone]

## 2022-08-12 LAB — RESPIRATORY ALLERGY PROFILE REGION II ~~LOC~~
Allergen, A. alternata, m6: <0.1 kU/L
Allergen, Cedar tree, t12: 0.15 kU/L — ABNORMAL HIGH
Allergen, Comm Silver Birch, t9: 0.15 kU/L — ABNORMAL HIGH
Allergen, Cottonwood, t14: 0.22 kU/L — ABNORMAL HIGH
Allergen, D pternoyssinus,d7: <0.1 kU/L
Allergen, Mouse Urine Protein, e78: <0.1 kU/L
Allergen, Mulberry, t76: 0.13 kU/L — ABNORMAL HIGH
Allergen, Oak,t7: 0.17 kU/L — ABNORMAL HIGH
Allergen, P. notatum, m1: <0.1 kU/L
Aspergillus fumigatus, m3: <0.1 kU/L
Bermuda Grass: 0.16 kU/L — ABNORMAL HIGH
Box Elder IgE: 0.15 kU/L — ABNORMAL HIGH
CLADOSPORIUM HERBARUM (M2) IGE: <0.1 kU/L
COMMON RAGWEED (SHORT) (W1) IGE: 0.21 kU/L — ABNORMAL HIGH
Cat Dander: <0.1 kU/L
Class: 0
Class: 0
Class: 0
Class: 0
Class: 0
Class: 0
Class: 0
Class: 0
Class: 0
Class: 1
Cockroach: 0.14 kU/L — ABNORMAL HIGH
D. farinae: <0.1 kU/L
Dog Dander: <0.1 kU/L
Elm IgE: 0.16 kU/L — ABNORMAL HIGH
IgE (Immunoglobulin E), Serum: 10 kU/L (ref <=114)
Johnson Grass: 0.18 kU/L — ABNORMAL HIGH
Pecan/Hickory Tree IgE: 0.46 kU/L — ABNORMAL HIGH
Rough Pigweed  IgE: 0.14 kU/L — ABNORMAL HIGH
Sheep Sorrel IgE: 0.14 kU/L — ABNORMAL HIGH
Timothy Grass: 0.18 kU/L — ABNORMAL HIGH

## 2022-08-12 LAB — INTERPRETATION:

## 2022-09-02 ENCOUNTER — Encounter: Payer: Self-pay | Admitting: Internal Medicine

## 2022-09-02 MED ORDER — DOXYCYCLINE HYCLATE 100 MG PO TABS: 100.0000 mg | ORAL_TABLET | Freq: Two times a day (BID) | ORAL | 0 refills | Status: DC

## 2022-09-02 NOTE — Telephone Encounter (Signed)
Dr. Shearon Stalls, please advise on pt's message regarding his sinus infection. Thanks.

## 2022-09-02 NOTE — Telephone Encounter (Signed)
Can try some doxycycline. Sent to pharmacy. If no improvement would schedule for video visit

## 2022-09-27 DIAGNOSIS — R7301 Impaired fasting glucose: Secondary | ICD-10-CM | POA: Diagnosis not present

## 2022-09-27 DIAGNOSIS — K2100 Gastro-esophageal reflux disease with esophagitis, without bleeding: Secondary | ICD-10-CM | POA: Diagnosis not present

## 2022-09-27 DIAGNOSIS — Z13220 Encounter for screening for lipoid disorders: Secondary | ICD-10-CM | POA: Diagnosis not present

## 2022-10-01 DIAGNOSIS — J4530 Mild persistent asthma, uncomplicated: Secondary | ICD-10-CM | POA: Diagnosis not present

## 2022-10-01 DIAGNOSIS — Z23 Encounter for immunization: Secondary | ICD-10-CM | POA: Diagnosis not present

## 2022-10-01 DIAGNOSIS — F411 Generalized anxiety disorder: Secondary | ICD-10-CM | POA: Diagnosis not present

## 2022-10-01 DIAGNOSIS — F900 Attention-deficit hyperactivity disorder, predominantly inattentive type: Secondary | ICD-10-CM | POA: Diagnosis not present

## 2022-10-01 DIAGNOSIS — K219 Gastro-esophageal reflux disease without esophagitis: Secondary | ICD-10-CM | POA: Diagnosis not present

## 2022-10-28 DIAGNOSIS — J069 Acute upper respiratory infection, unspecified: Secondary | ICD-10-CM | POA: Diagnosis not present

## 2022-10-28 DIAGNOSIS — H6990 Unspecified Eustachian tube disorder, unspecified ear: Secondary | ICD-10-CM | POA: Diagnosis not present

## 2022-10-28 DIAGNOSIS — J45901 Unspecified asthma with (acute) exacerbation: Secondary | ICD-10-CM | POA: Diagnosis not present

## 2022-10-28 DIAGNOSIS — Z6831 Body mass index (BMI) 31.0-31.9, adult: Secondary | ICD-10-CM | POA: Diagnosis not present

## 2023-01-03 DIAGNOSIS — K219 Gastro-esophageal reflux disease without esophagitis: Secondary | ICD-10-CM | POA: Diagnosis not present

## 2023-01-03 DIAGNOSIS — F900 Attention-deficit hyperactivity disorder, predominantly inattentive type: Secondary | ICD-10-CM | POA: Diagnosis not present

## 2023-01-03 DIAGNOSIS — J4530 Mild persistent asthma, uncomplicated: Secondary | ICD-10-CM | POA: Diagnosis not present

## 2023-01-03 DIAGNOSIS — F411 Generalized anxiety disorder: Secondary | ICD-10-CM | POA: Diagnosis not present

## 2023-01-24 ENCOUNTER — Encounter: Payer: Self-pay | Admitting: Internal Medicine

## 2023-01-24 NOTE — Telephone Encounter (Signed)
Advised pt to call office to schedule an acute appointment with NP. Nothing further needed at this time.

## 2023-01-28 ENCOUNTER — Ambulatory Visit (INDEPENDENT_AMBULATORY_CARE_PROVIDER_SITE_OTHER): Payer: BC Managed Care – PPO | Admitting: Nurse Practitioner

## 2023-01-28 ENCOUNTER — Encounter: Payer: Self-pay | Admitting: Nurse Practitioner

## 2023-01-28 VITALS — BP 128/80 | HR 90 | Ht 72.0 in | Wt 242.2 lb

## 2023-01-28 DIAGNOSIS — J358 Other chronic diseases of tonsils and adenoids: Secondary | ICD-10-CM | POA: Diagnosis not present

## 2023-01-28 DIAGNOSIS — J4540 Moderate persistent asthma, uncomplicated: Secondary | ICD-10-CM

## 2023-01-28 DIAGNOSIS — B9689 Other specified bacterial agents as the cause of diseases classified elsewhere: Secondary | ICD-10-CM

## 2023-01-28 DIAGNOSIS — R059 Cough, unspecified: Secondary | ICD-10-CM | POA: Diagnosis not present

## 2023-01-28 DIAGNOSIS — R058 Other specified cough: Secondary | ICD-10-CM

## 2023-01-28 DIAGNOSIS — J019 Acute sinusitis, unspecified: Secondary | ICD-10-CM

## 2023-01-28 LAB — POCT EXHALED NITRIC OXIDE: FeNO level (ppb): 11

## 2023-01-28 MED ORDER — BENZONATATE 200 MG PO CAPS: 200.0000 mg | ORAL_CAPSULE | Freq: Three times a day (TID) | ORAL | 1 refills | Status: DC | PRN

## 2023-01-28 MED ORDER — CEFDINIR 300 MG PO CAPS: 300.0000 mg | ORAL_CAPSULE | Freq: Two times a day (BID) | ORAL | 0 refills | Status: AC

## 2023-01-28 MED ORDER — PROMETHAZINE-DM 6.25-15 MG/5ML PO SYRP: 5.0000 mL | ORAL_SOLUTION | Freq: Four times a day (QID) | ORAL | 0 refills | Status: DC | PRN

## 2023-01-28 NOTE — Assessment & Plan Note (Addendum)
Would expect that his previous amoxicillin course would have appropriately covered for strep but possible this is a secondary infection as symptom onset was within the past few days and he has been off abx a week. He has tonsillar exudate and cervical LAD. No evidence of abscess on exam. Strep swab completed today. See above plan. Red flag symptoms reviewed.

## 2023-01-28 NOTE — Assessment & Plan Note (Addendum)
Unresolved symptoms. Given this and concern for strep pharyngitis, we will treat him with empiric course of cefdinir. Supportive care measures encouraged with saline rinses, nasal decongestant and intranasal steroids.

## 2023-01-28 NOTE — Progress Notes (Signed)
$'@Patient'I$  ID: Reola Mosher, male    DOB: 19-Jun-1991, 32 y.o.   MRN: [id number]  Chief Complaint  Patient presents with   Follow-up    Pt acute visit he reports he jas been sick since 2/13 states that PCP tx for a sinus infection w/ amoxicillin and prednisone. Pt states it slightly helped but he is still having congestion and on-going cough. States that he is having some chest/back soreness and sinus pain w/ headache    Referring provider: Manon Hilding, MD  HPI: 32 year old male, never smoker followed for moderate persistent asthma with exercise-induced bronchospasm and allergic rhinitis.  He is a patient of Dr. Mauricio Po and was last seen in office on 08/11/2022.  Past medical history significant for GERD.  TEST/EVENTS:  07/20/2021 PFTs: FVC 6.14 (106), FVC post 5.94 (102), FEV1 pre 4.77 (102), FEV1 post 4.78 (102), FEV1/FVC post 81. No significant BD response. Normal pulmonary function.  08/11/2022: OV with Dr. Shearon Stalls. Doing better. Still using Advair. No interval hospitalizations or ED visit. Feels it is not affecting his quality of life. Check allergen panel.   01/28/2023: Today - acute  Patient presents today for acute visit. Symptoms initially started 2/10. COVId testing at home was negative. Seen by his PCP on 2/13 and treated for sinus infection with 10 days of amoxicillin and a round of prednisone, which he completed 2/23. He felt some better on this but never fully resolve. He continues to have nasal congestion/pressure and yellow drainage. He has a persistent cough and occasionally produces some yellow phlegm, otherwise it's dry. He also noticed that over the past 2-3 days, his throat has become sore. Having some occasional headaches, which he attributes to the coughing. He denies any fevers, chills, hemoptysis, shortness of breath, difficulties swallowing, wheezing. Eating and drinking well. He is taking an over the counter decongestant. Using his advair and singulair. Hasn't required his  rescue. Doing saline rinse 1-2 times a day followed by flonase.   FeNO 11 ppb  No Known Allergies  Immunization History  Administered Date(s) Administered   Influenza,inj,Quad PF,6+ Mos 09/26/2020, 08/28/2021   Moderna Sars-Covid-2 Vaccination 12/29/2019, 01/26/2020, 10/11/2020    Past Medical History:  Diagnosis Date   Allergic rhinitis    Asthma    GERD (gastroesophageal reflux disease)     Tobacco History: Social History   Tobacco Use  Smoking Status Never  Smokeless Tobacco Never   Counseling given: Not Answered    Outpatient Medications Prior to Visit  Medication Sig Dispense Refill   albuterol (VENTOLIN HFA) 108 (90 Base) MCG/ACT inhaler Inhale 2 puffs into the lungs 4 (four) times daily.     amphetamine-dextroamphetamine (ADDERALL XR) 15 MG 24 hr capsule Take 15 mg by mouth every morning.     esomeprazole (NEXIUM) 40 MG capsule Take by mouth.     FLUoxetine (PROZAC) 20 MG capsule Take 1 capsule by mouth daily.     fluticasone-salmeterol (ADVAIR HFA) 230-21 MCG/ACT inhaler Inhale 2 puffs into the lungs 2 (two) times daily. 1 each 12   methylphenidate 36 MG PO CR tablet Take 1 tablet by mouth daily.     montelukast (SINGULAIR) 10 MG tablet Take 1 tablet (10 mg total) by mouth at bedtime. 30 tablet 11   Spacer/Aero-Holding Chambers (AEROCHAMBER PLUS) inhaler Use with inhaler 1 each 2   fluticasone (FLONASE) 50 MCG/ACT nasal spray Place 2 sprays into both nostrils daily. (Patient not taking: Reported on 01/28/2023) 16 g 0   doxycycline (VIBRA-TABS) 100 MG tablet  Take 1 tablet (100 mg total) by mouth 2 (two) times daily. 10 tablet 0   No facility-administered medications prior to visit.     Review of Systems:   Constitutional: No weight loss or gain, night sweats, fevers, chills, fatigue, or lassitude. HEENT: No difficulty swallowing, tooth/dental problems. No sneezing, itching, ear ache. +nasal congestion/drainage, sinus headaches/pressure, sore throat,  hoarseness CV:  No chest pain, orthopnea, PND, swelling in lower extremities, anasarca, dizziness, palpitations, syncope Resp: +cough, minimally productive. No shortness of breath or wheezing. No hemoptysis. No wheezing.  No chest wall deformity GI:  No heartburn, indigestion, abdominal pain, nausea, vomiting, diarrhea, change in bowel habits, loss of appetite, bloody stools.  GU: No dysuria, change in color of urine, urgency or frequency.   Skin: No rash, lesions, ulcerations MSK:  No joint pain or swelling Neuro: No dizziness or lightheadedness.  Psych: No depression or anxiety. Mood stable.     Physical Exam:  BP 128/80   Pulse 90   Ht 6' (1.829 m)   Wt 242 lb 3.2 oz (109.9 kg)   SpO2 98%   BMI 32.85 kg/m   GEN: Pleasant, interactive, well-nourished; in no acute distress. HEENT:  Normocephalic and atraumatic. EACs with cerumen impaction b/l. PERRLA. Sclera white. Nasal turbinates erythematous, moist and patent bilaterally. Clear rhinorrhea present. Sinus tenderness. Pharynx with right sided erythema and tonsillar exudate, no edema or evidence of abscess. No lesions, ulcerations NECK:  Supple w/ fair ROM. No JVD present. Normal carotid impulses w/o bruits. Thyroid symmetrical with no goiter or nodules palpated. Cervical LAD CV: RRR, no m/r/g, no peripheral edema. Pulses intact, +2 bilaterally. No cyanosis, pallor or clubbing. PULMONARY:  Unlabored, regular breathing. Clear bilaterally A&P w/o wheezes/rales/rhonchi. Reactive cough. No accessory muscle use. No dullness to percussion. GI: BS present and normoactive. Soft, non-tender to palpation. No organomegaly or masses detected. No CVA tenderness. MSK: No erythema, tenderness, warmth, or ecchymosis. Cap refil <2 sec all extrem. No deformities or joint swelling noted.  Neuro: A/Ox3. No focal deficits noted.   Skin: Warm, no lesions or rashe Psych: Normal affect and behavior. Judgement and thought content appropriate.     Lab  Results:  CBC    Component Value Date/Time   WBC 9.8 10/26/2021 1647   RBC 5.20 10/26/2021 1647   HGB 14.6 10/26/2021 1647   HCT 43.6 10/26/2021 1647   PLT 421.0 (H) 10/26/2021 1647   MCV 83.9 10/26/2021 1647   MCHC 33.4 10/26/2021 1647   RDW 13.5 10/26/2021 1647   LYMPHSABS 3.1 10/26/2021 1647   MONOABS 0.8 10/26/2021 1647   EOSABS 0.2 10/26/2021 1647   BASOSABS 0.1 10/26/2021 1647    BMET    Component Value Date/Time   NA 139 10/26/2021 1647   K 4.3 10/26/2021 1647   CL 104 10/26/2021 1647   CO2 27 10/26/2021 1647   GLUCOSE 83 10/26/2021 1647   BUN 12 10/26/2021 1647   CREATININE 0.95 10/26/2021 1647   CALCIUM 9.9 10/26/2021 1647    BNP No results found for: "BNP"   Imaging:  No results found.       Latest Ref Rng & Units 07/20/2021    3:54 PM  PFT Results  FVC-Pre L 6.14   FVC-Predicted Pre % 106   FVC-Post L 5.94   FVC-Predicted Post % 102   Pre FEV1/FVC % % 78   Post FEV1/FCV % % 81   FEV1-Pre L 4.77   FEV1-Predicted Pre % 102   FEV1-Post L 4.78  No results found for: "NITRICOXIDE"      Assessment & Plan:   Moderate persistent asthma He does not appear to be in acute exacerbation. FeNO is nl today. Suspect cough is primarily from upper airway irritation due to postnasal drainage. He will continue ICS/LABA therapy. Action plan in place.   Patient Instructions  Continue Albuterol inhaler 2 puffs every 6 hours as needed for shortness of breath or wheezing. Notify if symptoms persist despite rescue inhaler/neb use.  Continue Advair 2 puffs Twice daily. Brush tongue and rinse mouth afterwards Continue singulair 1 tab At bedtime  Continue flonase 2 sprays each nostril daily. Use 20-30 min after saline rinses  Guaifenesin [phone] mg Twice daily for congestion Promethazine DM cough syrup 5 mL every 6 hours as needed for cough. May cause drowsiness. Do not drive after taking Benzonatate 1 capsule Three times a day for cough. Take consistently  over the next 3-4 days  Cefdinir 1 capsule Twice daily for 7 days. Take with food  You can use an over the counter nasal decongestant like Afrin but do not use longer than 3 days   Follow up in 2 weeks with Dr. Shearon Stalls or Joellen Jersey Parisha Beaulac,NP. If symptoms do not improve or worsen, please contact office for sooner follow up or seek emergency care.    Acute bacterial rhinosinusitis Unresolved symptoms. Given this and concern for strep pharyngitis, we will treat him with empiric course of cefdinir. Supportive care measures encouraged with saline rinses, nasal decongestant and intranasal steroids.   Upper airway cough syndrome Upper airway irritation from postnasal drainage. FeNO nl today. Will hold off on further steroid use. Cough control measures. Breathing overall stable. If symptoms persist or worsen, will obtain further workup with imaging.   Tonsillar exudate Would expect that his previous amoxicillin course would have appropriately covered for strep but possible this is a secondary infection as symptom onset was within the past few days and he has been off abx a week. He has tonsillar exudate and cervical LAD. No evidence of abscess on exam. Strep swab completed today. See above plan. Red flag symptoms reviewed.    I spent 45 minutes of dedicated to the care of this patient on the date of this encounter to include pre-visit review of records, face-to-face time with the patient discussing conditions above, post visit ordering of testing, clinical documentation with the electronic health record, making appropriate referrals as documented, and communicating necessary findings to members of the patients care team.   Clayton Bibles, NP 01/28/2023  Pt aware and understands NP's role.

## 2023-01-28 NOTE — Patient Instructions (Addendum)
Continue Albuterol inhaler 2 puffs every 6 hours as needed for shortness of breath or wheezing. Notify if symptoms persist despite rescue inhaler/neb use.  Continue Advair 2 puffs Twice daily. Brush tongue and rinse mouth afterwards Continue singulair 1 tab At bedtime  Continue flonase 2 sprays each nostril daily. Use 20-30 min after saline rinses  Guaifenesin [phone] mg Twice daily for congestion Promethazine DM cough syrup 5 mL every 6 hours as needed for cough. May cause drowsiness. Do not drive after taking Benzonatate 1 capsule Three times a day for cough. Take consistently over the next 3-4 days  Cefdinir 1 capsule Twice daily for 7 days. Take with food  You can use an over the counter nasal decongestant like Afrin but do not use longer than 3 days   Follow up in 2 weeks with Michael Huber or Michael Jersey [name],NP. If symptoms do not improve or worsen, please contact office for sooner follow up or seek emergency care.

## 2023-01-28 NOTE — Assessment & Plan Note (Signed)
He does not appear to be in acute exacerbation. FeNO is nl today. Suspect cough is primarily from upper airway irritation due to postnasal drainage. He will continue ICS/LABA therapy. Action plan in place.   Patient Instructions  Continue Albuterol inhaler 2 puffs every 6 hours as needed for shortness of breath or wheezing. Notify if symptoms persist despite rescue inhaler/neb use.  Continue Advair 2 puffs Twice daily. Brush tongue and rinse mouth afterwards Continue singulair 1 tab At bedtime  Continue flonase 2 sprays each nostril daily. Use 20-30 min after saline rinses  Guaifenesin [phone] mg Twice daily for congestion Promethazine DM cough syrup 5 mL every 6 hours as needed for cough. May cause drowsiness. Do not drive after taking Benzonatate 1 capsule Three times a day for cough. Take consistently over the next 3-4 days  Cefdinir 1 capsule Twice daily for 7 days. Take with food  You can use an over the counter nasal decongestant like Afrin but do not use longer than 3 days   Follow up in 2 weeks with Dr. Shearon Stalls or Joellen Jersey Adeeb Konecny,NP. If symptoms do not improve or worsen, please contact office for sooner follow up or seek emergency care.

## 2023-01-28 NOTE — Assessment & Plan Note (Signed)
Upper airway irritation from postnasal drainage. FeNO nl today. Will hold off on further steroid use. Cough control measures. Breathing overall stable. If symptoms persist or worsen, will obtain further workup with imaging.

## 2023-01-30 LAB — CULTURE, GROUP A STREP
MICRO NUMBER:: 14637697
SPECIMEN QUALITY:: ADEQUATE

## 2023-02-02 NOTE — Progress Notes (Signed)
Strep culture negative. Can we call and find out how he's feeling? Thanks!

## 2023-02-07 NOTE — Progress Notes (Signed)
Needs sooner f/u if not improving. Otherwise, if slowly getting better, can wait to see Dr. Shearon Stalls on 3/20. Thanks.

## 2023-02-09 DIAGNOSIS — R490 Dysphonia: Secondary | ICD-10-CM

## 2023-02-09 DIAGNOSIS — R058 Other specified cough: Secondary | ICD-10-CM

## 2023-02-09 MED ORDER — PREDNISONE 10 MG PO TABS: ORAL_TABLET | ORAL | 0 refills | Status: DC

## 2023-02-09 MED ORDER — HYDROCODONE BIT-HOMATROP MBR 5-1.5 MG/5ML PO SOLN: 5.0000 mL | Freq: Four times a day (QID) | ORAL | 0 refills | Status: DC | PRN

## 2023-02-09 NOTE — Telephone Encounter (Signed)
Received the following message from patient:   "Michael Huber all is well. Your nurse called to follow up Monday and I missed her call. I'm out of town for work at [employer] and [employer]. I did want to say I still don't have much of a voice. I'm coughing still, also I have been having a lot of pain on my left side of my chest and feels a lot like a possible rib issue again. I will definitely be coming in for my follow up next week. There are times I think I'm getting better and then times it seems to go backwards. But I did want to let y'all know what's going on."  Will route to [place] as a FYI.

## 2023-02-09 NOTE — Telephone Encounter (Signed)
Rx sent to pharmacy   

## 2023-02-09 NOTE — Telephone Encounter (Signed)
Received message from patient:   "I'm in [place] until Friday. You can go ahead and send them to my local pharmacy in [place]. [place]. I will have my wife pick them up for me and begin taking them as soon as I get in Friday. "

## 2023-02-09 NOTE — Telephone Encounter (Signed)
I think we should try another course of steroids and I'm going to send a different cough syrup in. Can you ask him what local pharmacy he would like these sent to and then send back to me? He does not need to drive after he uses the cough syrup. Should be using benzonatate 1 capsule Three times a day consistently. We can send refill if needed. Also sending a referral to ENT. I still want him to come  next week. Thanks!

## 2023-02-16 ENCOUNTER — Encounter: Payer: Self-pay | Admitting: Internal Medicine

## 2023-02-16 ENCOUNTER — Ambulatory Visit (INDEPENDENT_AMBULATORY_CARE_PROVIDER_SITE_OTHER): Payer: BC Managed Care – PPO | Admitting: Internal Medicine

## 2023-02-16 VITALS — BP 126/88 | HR 82 | Temp 98.2°F | Ht 72.0 in | Wt 246.8 lb

## 2023-02-16 DIAGNOSIS — M940 Chondrocostal junction syndrome [Tietze]: Secondary | ICD-10-CM

## 2023-02-16 DIAGNOSIS — J4541 Moderate persistent asthma with (acute) exacerbation: Secondary | ICD-10-CM

## 2023-02-16 DIAGNOSIS — R058 Other specified cough: Secondary | ICD-10-CM

## 2023-02-16 MED ORDER — CHLORPHENIRAMINE MALEATE ER 12 MG PO TBCR: 1.0000 | EXTENDED_RELEASE_TABLET | Freq: Two times a day (BID) | ORAL | 0 refills | Status: DC

## 2023-02-16 MED ORDER — PSEUDOEPHEDRINE HCL 60 MG PO TABS: 60.0000 mg | ORAL_TABLET | ORAL | 0 refills | Status: DC | PRN

## 2023-02-16 NOTE — Progress Notes (Signed)
Michael Huber    LF:[id number]    03/21/1991  Primary Care Physician:Sasser, Silvestre Moment, MD Date of Appointment: 02/16/2023 Established Patient Visit  Chief complaint:   Chief Complaint  Patient presents with   Follow-up    Sinus infection, pt has a cough and hoarse voice. ( 2 months )  pt has concerns of soreness on the left side of the body near rib cage and upper left backside. Pt still on pred taper      HPI: Michael Huber is a 32 y.o. man with moderate persistent asthma.  Interval Updates: Here for follow up for asthma with acute exacerbation. Had URI in Feb 2024 treated with doxycycline, prednisone taper x2. No sick contacts. Has had significant coughing. Still having pain with coughing and splinting. Has had laryngitis. Voice is slowly coming back. Cough is persistent and he has pain on his left upper chest.   Still having nasal drainage and congestion in the morning.  Taking flonase twice a day. Still with cetirizine and montelukast.     Current Regimen:  advair 230 2 puffs twice a day. albuterol prn Asthma Triggers: taking flonase, allergies Exacerbations in the last year: 2 since feb 2024 for URI.  History of hospitalization or intubation: none Allergy Testing: none GERD: still on PPI Allergic Rhinitis: yes still. 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   I have reviewed the patient's family social and past medical history and updated as appropriate.   Past Medical History:  Diagnosis Date   Allergic rhinitis    Asthma    GERD (gastroesophageal reflux disease)     No past surgical history on file.  Family History  Problem Relation Age of Onset   Allergic Disorder Mother    Allergic rhinitis Brother    Asthma Neg Hx     Social History   Occupational History   Not on  file  Tobacco Use   Smoking status: Never   Smokeless tobacco: Never  Vaping Use   Vaping Use: Never used  Substance and Sexual Activity   Alcohol use: Yes   Drug use: Not Currently   Sexual activity: Not on file     Physical Exam: Blood pressure 126/88, pulse 82, temperature 98.2 F (36.8 C), temperature source Oral, height 6' (1.829 m), weight 246 lb 12.8 oz (111.9 kg), SpO2 98 %.  Gen:      No acute distress, hoarse voice ENT:  mild cobblestoning, no thrush Lungs:    ctab no wheezes or crackles CV:       RRR no mrg MSK: Left chest wall ttp   Data Reviewed: Imaging: I have previously personally reviewed the chest xray October 2021 which shows no acute cardiopulmonary process.  PFTs: No airflow limitation    Latest Ref Rng & Units 07/20/2021    3:54 PM  PFT Results  FVC-Pre L 6.14   FVC-Predicted Pre % 106   FVC-Post L 5.94   FVC-Predicted Post % 102   Pre FEV1/FVC % % 78   Post FEV1/FCV % % 81   FEV1-Pre L 4.77   FEV1-Predicted Pre % 102   FEV1-Post L 4.78    I have personally reviewed the patient's PFTs and normal pulmonary function.   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 continue prn albuterol.  continue zyrtec with flonase and singulair. continue nasal saline spray Continue PPI  I recommend pseduoephedrine twice daily with chlorpheniramine twice daily for the next few days   Ibuprofen 600 mg up to 3 times a day with food for no more than 4-5 days.  Over the counter rubs like bengay and icy hot Salon-pas patches (or similar) from drug store  Return to  Care: No follow-ups on file.   Lenice Llamas, MD Pulmonary and [place]

## 2023-02-16 NOTE — Patient Instructions (Addendum)
Please schedule follow up scheduled with myself in 6 months.  If my schedule is not open yet, we will contact you with a reminder closer to that time. Please call [phone] if you haven't heard from [place] a month before.    Sorry you are still having some congestion and drainage which is making the cough worse.   I recommend pseduoephedrine twice daily with chlorpheniramine twice daily for the next few days to help it along. Both of these are available over the counter but I have also sent them to your pharmacy.   Continue nasal saline, flonase, cetirizine and singulair.   Continue advair for asthma.   For chest pain related to coughing:  Ibuprofen 600 mg up to 3 times a day with food for no more than 4-5 days.  Over the counter rubs like bengay and icy hot Salon-pas patches (or similar) from drug store

## 2023-03-08 DIAGNOSIS — S134XXA Sprain of ligaments of cervical spine, initial encounter: Secondary | ICD-10-CM | POA: Diagnosis not present

## 2023-03-08 DIAGNOSIS — S233XXA Sprain of ligaments of thoracic spine, initial encounter: Secondary | ICD-10-CM | POA: Diagnosis not present

## 2023-03-08 DIAGNOSIS — S338XXA Sprain of other parts of lumbar spine and pelvis, initial encounter: Secondary | ICD-10-CM | POA: Diagnosis not present

## 2023-03-10 DIAGNOSIS — S338XXA Sprain of other parts of lumbar spine and pelvis, initial encounter: Secondary | ICD-10-CM | POA: Diagnosis not present

## 2023-03-10 DIAGNOSIS — S233XXA Sprain of ligaments of thoracic spine, initial encounter: Secondary | ICD-10-CM | POA: Diagnosis not present

## 2023-03-10 DIAGNOSIS — S134XXA Sprain of ligaments of cervical spine, initial encounter: Secondary | ICD-10-CM | POA: Diagnosis not present

## 2023-03-21 DIAGNOSIS — J069 Acute upper respiratory infection, unspecified: Secondary | ICD-10-CM | POA: Diagnosis not present

## 2023-03-21 DIAGNOSIS — J0100 Acute maxillary sinusitis, unspecified: Secondary | ICD-10-CM | POA: Diagnosis not present

## 2023-03-21 DIAGNOSIS — Z6833 Body mass index (BMI) 33.0-33.9, adult: Secondary | ICD-10-CM | POA: Diagnosis not present

## 2023-04-11 DIAGNOSIS — F900 Attention-deficit hyperactivity disorder, predominantly inattentive type: Secondary | ICD-10-CM | POA: Diagnosis not present

## 2023-04-11 DIAGNOSIS — K219 Gastro-esophageal reflux disease without esophagitis: Secondary | ICD-10-CM | POA: Diagnosis not present

## 2023-04-11 DIAGNOSIS — J4530 Mild persistent asthma, uncomplicated: Secondary | ICD-10-CM | POA: Diagnosis not present

## 2023-04-11 DIAGNOSIS — F411 Generalized anxiety disorder: Secondary | ICD-10-CM | POA: Diagnosis not present

## 2023-04-26 DIAGNOSIS — S90861A Insect bite (nonvenomous), right foot, initial encounter: Secondary | ICD-10-CM | POA: Diagnosis not present

## 2023-04-26 DIAGNOSIS — L03115 Cellulitis of right lower limb: Secondary | ICD-10-CM | POA: Diagnosis not present

## 2023-04-26 DIAGNOSIS — R030 Elevated blood-pressure reading, without diagnosis of hypertension: Secondary | ICD-10-CM | POA: Diagnosis not present

## 2023-04-26 DIAGNOSIS — Z6834 Body mass index (BMI) 34.0-34.9, adult: Secondary | ICD-10-CM | POA: Diagnosis not present

## 2023-04-27 ENCOUNTER — Emergency Department (HOSPITAL_COMMUNITY)
Admission: EM | Admit: 2023-04-27 | Discharge: 2023-04-28 | Disposition: A | Discharge status: Home / Self Care | Payer: BC Managed Care – PPO | Attending: Emergency Medicine | Admitting: Emergency Medicine

## 2023-04-27 ENCOUNTER — Other Ambulatory Visit: Payer: Self-pay

## 2023-04-27 ENCOUNTER — Encounter (HOSPITAL_COMMUNITY): Payer: Self-pay | Admitting: Emergency Medicine

## 2023-04-27 DIAGNOSIS — A419 Sepsis, unspecified organism: Secondary | ICD-10-CM | POA: Diagnosis not present

## 2023-04-27 DIAGNOSIS — R109 Unspecified abdominal pain: Secondary | ICD-10-CM | POA: Diagnosis not present

## 2023-04-27 DIAGNOSIS — B349 Viral infection, unspecified: Secondary | ICD-10-CM | POA: Diagnosis not present

## 2023-04-27 DIAGNOSIS — Z20822 Contact with and (suspected) exposure to covid-19: Secondary | ICD-10-CM | POA: Diagnosis not present

## 2023-04-27 DIAGNOSIS — E669 Obesity, unspecified: Secondary | ICD-10-CM | POA: Diagnosis not present

## 2023-04-27 DIAGNOSIS — R519 Headache, unspecified: Secondary | ICD-10-CM | POA: Insufficient documentation

## 2023-04-27 DIAGNOSIS — L03115 Cellulitis of right lower limb: Secondary | ICD-10-CM

## 2023-04-27 DIAGNOSIS — Z6834 Body mass index (BMI) 34.0-34.9, adult: Secondary | ICD-10-CM | POA: Diagnosis not present

## 2023-04-27 DIAGNOSIS — R319 Hematuria, unspecified: Secondary | ICD-10-CM

## 2023-04-27 DIAGNOSIS — J111 Influenza due to unidentified influenza virus with other respiratory manifestations: Secondary | ICD-10-CM | POA: Diagnosis not present

## 2023-04-27 LAB — COMPREHENSIVE METABOLIC PANEL
ALT: 37 U/L (ref 0–44)
AST: 21 U/L (ref 15–41)
Albumin: 4.2 g/dL (ref 3.5–5.0)
Alkaline Phosphatase: 57 U/L (ref 38–126)
Anion gap: 10 (ref 5–15)
BUN: 13 mg/dL (ref 6–20)
CO2: 24 mmol/L (ref 22–32)
Calcium: 9 mg/dL (ref 8.9–10.3)
Chloride: 100 mmol/L (ref 98–111)
Creatinine, Ser: 0.77 mg/dL (ref 0.61–1.24)
GFR, Estimated: >60 mL/min (ref >60)
Glucose, Bld: 105 mg/dL — ABNORMAL HIGH (ref 70–99)
Potassium: 3.8 mmol/L (ref 3.5–5.1)
Sodium: 134 mmol/L — ABNORMAL LOW (ref 135–145)
Total Bilirubin: 0.6 mg/dL (ref 0.3–1.2)
Total Protein: 7.7 g/dL (ref 6.5–8.1)

## 2023-04-27 LAB — CBC WITH DIFFERENTIAL/PLATELET
Abs Immature Granulocytes: 0.08 10*3/uL — ABNORMAL HIGH (ref 0.00–0.07)
Basophils Absolute: 0.1 10*3/uL (ref 0.0–0.1)
Basophils Relative: 0 %
Eosinophils Absolute: 0.1 10*3/uL (ref 0.0–0.5)
Eosinophils Relative: 1 %
HCT: 43.8 % (ref 39.0–52.0)
Hemoglobin: 14.4 g/dL (ref 13.0–17.0)
Immature Granulocytes: 1 %
Lymphocytes Relative: 13 %
Lymphs Abs: 1.5 10*3/uL (ref 0.7–4.0)
MCH: 28.2 pg (ref 26.0–34.0)
MCHC: 32.9 g/dL (ref 30.0–36.0)
MCV: 85.9 fL (ref 80.0–100.0)
Monocytes Absolute: 1 10*3/uL (ref 0.1–1.0)
Monocytes Relative: 9 %
Neutro Abs: 8.8 10*3/uL — ABNORMAL HIGH (ref 1.7–7.7)
Neutrophils Relative %: 76 %
Platelets: 346 10*3/uL (ref 150–400)
RBC: 5.1 MIL/uL (ref 4.22–5.81)
RDW: 13 % (ref 11.5–15.5)
WBC: 11.5 10*3/uL — ABNORMAL HIGH (ref 4.0–10.5)
nRBC: 0 % (ref 0.0–0.2)

## 2023-04-27 LAB — URINALYSIS, ROUTINE W REFLEX MICROSCOPIC
Bilirubin Urine: NEGATIVE
Glucose, UA: NEGATIVE mg/dL
Ketones, ur: NEGATIVE mg/dL
Leukocytes,Ua: NEGATIVE
Nitrite: NEGATIVE
Protein, ur: 30 mg/dL — AB
Specific Gravity, Urine: 1.025 (ref 1.005–1.030)
pH: 6 (ref 5.0–8.0)

## 2023-04-27 LAB — LACTIC ACID, PLASMA
Lactic Acid, Venous: 0.9 mmol/L (ref 0.5–1.9)
Lactic Acid, Venous: 1.1 mmol/L (ref 0.5–1.9)

## 2023-04-27 MED ORDER — SODIUM CHLORIDE 0.9 % IV BOLUS
1000.0000 mL | Freq: Once | INTRAVENOUS | Status: AC
  Administered 2023-04-28: 1000 mL via INTRAVENOUS

## 2023-04-27 MED ORDER — ONDANSETRON HCL 4 MG/2ML IJ SOLN
4.0000 mg | Freq: Once | INTRAMUSCULAR | Status: AC
  Administered 2023-04-28: 4 mg via INTRAVENOUS
  Filled 2023-04-27: qty 2

## 2023-04-27 MED ORDER — HYDROMORPHONE HCL 1 MG/ML IJ SOLN
1.0000 mg | Freq: Once | INTRAMUSCULAR | Status: AC
  Administered 2023-04-28: 1 mg via INTRAVENOUS
  Filled 2023-04-27: qty 1

## 2023-04-27 MED ORDER — SODIUM CHLORIDE 0.9 % IV SOLN: INTRAVENOUS | Status: DC

## 2023-04-27 NOTE — ED Triage Notes (Signed)
Pt went to urgent care yesterday for an unknown insect bite to right ankle, noticed on Monday, and received rx for doxycycline. Today pt feels feverish and nauseated and the bite area is not looking better so he went back to UC and was advised to come here instead. Pain rated 8/10 in head, 6/10 generalized body aches, 6/10 right ankle.

## 2023-04-28 ENCOUNTER — Emergency Department (HOSPITAL_COMMUNITY): Payer: BC Managed Care – PPO

## 2023-04-28 DIAGNOSIS — R519 Headache, unspecified: Secondary | ICD-10-CM | POA: Diagnosis not present

## 2023-04-28 DIAGNOSIS — J111 Influenza due to unidentified influenza virus with other respiratory manifestations: Secondary | ICD-10-CM | POA: Diagnosis not present

## 2023-04-28 DIAGNOSIS — R109 Unspecified abdominal pain: Secondary | ICD-10-CM | POA: Diagnosis not present

## 2023-04-28 LAB — SARS CORONAVIRUS 2 BY RT PCR: SARS Coronavirus 2 by RT PCR: NEGATIVE

## 2023-04-28 MED ORDER — ONDANSETRON 4 MG PO TBDP: 4.0000 mg | ORAL_TABLET | Freq: Three times a day (TID) | ORAL | 0 refills | Status: DC | PRN

## 2023-04-28 MED ORDER — ONDANSETRON HCL 4 MG/2ML IJ SOLN
4.0000 mg | Freq: Once | INTRAMUSCULAR | Status: AC
  Administered 2023-04-28: 4 mg via INTRAVENOUS
  Filled 2023-04-28: qty 2

## 2023-04-28 MED ORDER — HYDROMORPHONE HCL 1 MG/ML IJ SOLN
1.0000 mg | Freq: Once | INTRAMUSCULAR | Status: AC
  Administered 2023-04-28: 1 mg via INTRAVENOUS
  Filled 2023-04-28: qty 1

## 2023-04-28 NOTE — ED Provider Notes (Signed)
[place] [facility]GENC[facility] DEPARTMENT AT [facility] Provider Note   CSN: [id number] Arrival date & time: 04/27/23  1801     History  Chief Complaint  Patient presents with   Insect Bite    Michael Huber is a 32 y.o. male.  Patient sent in by urgent care due to an insect bite with cellulitis to the right ankle area is actually sort of to the posterior part of the heel noticed that on Monday.  He started taking doxycycline yesterday so has only had 2 doses in the 1 dose today.  Today started feeling nauseated feeling a little feverish temperature here though 98.4 he went to urgent care urgent care sent him here for concerns for sepsis.  Patient talked about some body aches and head headache pain but no neck stiffness.  No photophobia.  Upon arrival here Temp was 98.4 blood pressure 141/90 oxygen sats 98% respiration 18 and heart rate 97.  Past medical history is significant for gastroesophageal reflux disease asthma.  Patient is a non-smoker.  Patient does have children at home but no one is currently sick.  No history of tick bite.  But not sure what bit him on the right ankle area.       Home Medications Prior to Admission medications   Medication Sig Start Date End Date Taking? Authorizing Provider  ondansetron (ZOFRAN-ODT) 4 MG disintegrating tablet Take 1 tablet (4 mg total) by mouth every 8 (eight) hours as needed for nausea or vomiting. 04/28/23  Yes Vanetta Mulders, MD  albuterol (VENTOLIN HFA) 108 (90 Base) MCG/ACT inhaler Inhale 2 puffs into the lungs 4 (four) times daily. 09/17/20   [provider]  amphetamine-dextroamphetamine (ADDERALL XR) 15 MG 24 hr capsule Take 15 mg by mouth every morning. 01/27/23   [provider]  benzonatate (TESSALON) 200 MG capsule Take 1 capsule (200 mg total) by mouth 3 (three) times daily as needed for cough. 01/28/23   Cobb, Ruby Cola, NP  Chlorpheniramine Maleate 12 MG TBCR Take 1 tablet (12 mg total) by mouth in the  morning and at bedtime for 14 days. 02/16/23 03/02/23  Charlott Holler, MD  esomeprazole (NEXIUM) 40 MG capsule Take by mouth. 08/17/17   [provider]  FLUoxetine (PROZAC) 20 MG capsule Take 1 capsule by mouth daily. 08/09/17   [provider]  fluticasone (FLONASE) 50 MCG/ACT nasal spray Place 2 sprays into both nostrils daily. Patient not taking: Reported on 01/28/2023 09/13/21   Domenick Gong, MD  fluticasone-salmeterol (ADVAIR HFA) [id number] MCG/ACT inhaler Inhale 2 puffs into the lungs 2 (two) times daily. 08/11/22   Charlott Holler, MD  HYDROcodone bit-homatropine (HYCODAN) 5-1.5 MG/5ML syrup Take 5 mLs by mouth every 6 (six) hours as needed for cough. 02/09/23   Cobb, Ruby Cola, NP  methylphenidate 36 MG PO CR tablet Take 1 tablet by mouth daily. Patient not taking: Reported on 02/16/2023 08/06/17   [provider]  montelukast (SINGULAIR) 10 MG tablet Take 1 tablet (10 mg total) by mouth at bedtime. 08/11/22   Charlott Holler, MD  predniSONE (DELTASONE) 10 MG tablet 4 tabs for 3 days, then 3 tabs for 3 days, 2 tabs for 3 days, then 1 tab for 3 days, then stop 02/09/23   Cobb, Ruby Cola, NP  pseudoephedrine (SUDAFED) 60 MG tablet Take 1 tablet (60 mg total) by mouth every 4 (four) hours as needed for congestion. 02/16/23   Charlott Holler, MD  Spacer/Aero-Holding Chambers (AEROCHAMBER PLUS) inhaler  Use with inhaler 09/13/21   Domenick Gong, MD      Allergies    Patient has no known allergies.    Review of Systems   Review of Systems  Constitutional:  Negative for chills and fever.  HENT:  Positive for sore throat. Negative for ear pain.   Eyes:  Negative for pain and visual disturbance.  Respiratory:  Negative for cough and shortness of breath.   Cardiovascular:  Negative for chest pain and palpitations.  Gastrointestinal:  Negative for abdominal pain and vomiting.  Genitourinary:  Negative for dysuria and hematuria.  Musculoskeletal:  Positive for myalgias.  Negative for arthralgias and back pain.  Skin:  Positive for wound. Negative for color change and rash.  Neurological:  Positive for headaches. Negative for seizures and syncope.  All other systems reviewed and are negative.   Physical Exam Updated Vital Signs BP 132/84   Pulse 81   Temp 98.4 F (36.9 C)   Resp 18   Ht 1.829 m (6')   Wt 108.9 kg   SpO2 100%   BMI 32.55 kg/m  Physical Exam Vitals and nursing note reviewed.  Constitutional:      General: He is not in acute distress.    Appearance: Normal appearance. He is well-developed.  HENT:     Head: Normocephalic and atraumatic.     Mouth/Throat:     Mouth: Mucous membranes are moist.     Pharynx: Posterior oropharyngeal erythema present.  Eyes:     Conjunctiva/sclera: Conjunctivae normal.  Cardiovascular:     Rate and Rhythm: Normal rate and regular rhythm.     Heart sounds: No murmur heard. Pulmonary:     Effort: Pulmonary effort is normal. No respiratory distress.     Breath sounds: Normal breath sounds.  Abdominal:     Palpations: Abdomen is soft.     Tenderness: There is no abdominal tenderness.  Musculoskeletal:        General: Swelling present.     Cervical back: Normal range of motion and neck supple. No rigidity.     Comments: The right heel with an area of erythema measuring about 4 to 5 cm with a lesion that could be a bug bite in the center.  No necrosis.  Dorsalis pedis pulses 2+.  No erythema extending up the leg no significant swelling to the leg.  No significant swelling to the foot.  No crepitance.  Skin:    General: Skin is warm and dry.     Capillary Refill: Capillary refill takes less than 2 seconds.  Neurological:     General: No focal deficit present.     Mental Status: He is alert and oriented to person, place, and time.     Cranial Nerves: No cranial nerve deficit.     Sensory: No sensory deficit.     Motor: No weakness.  Psychiatric:        Mood and Affect: Mood normal.     ED  Results / Procedures / Treatments   Labs (all labs ordered are listed, but only abnormal results are displayed) Labs Reviewed  COMPREHENSIVE METABOLIC PANEL - Abnormal; Notable for the following components:      Result Value   Sodium 134 (*)    Glucose, Bld 105 (*)    All other components within normal limits  CBC WITH DIFFERENTIAL/PLATELET - Abnormal; Notable for the following components:   WBC 11.5 (*)    Neutro Abs 8.8 (*)    Abs Immature Granulocytes  0.08 (*)    All other components within normal limits  URINALYSIS, ROUTINE W REFLEX MICROSCOPIC - Abnormal; Notable for the following components:   Hgb urine dipstick MODERATE (*)    Protein, ur 30 (*)    Bacteria, UA RARE (*)    All other components within normal limits  SARS CORONAVIRUS 2 BY RT PCR  CULTURE, BLOOD (ROUTINE X 2)  CULTURE, BLOOD (ROUTINE X 2)  RESP PANEL BY RT-PCR (RSV, FLU A&B, COVID)  RVPGX2  LACTIC ACID, PLASMA  LACTIC ACID, PLASMA    EKG None  Radiology CT Head Wo Contrast  Result Date: 04/28/2023 CLINICAL DATA:  Headache, sudden, severe EXAM: CT HEAD WITHOUT CONTRAST TECHNIQUE: Contiguous axial images were obtained from the base of the skull through the vertex without intravenous contrast. RADIATION DOSE REDUCTION: This exam was performed according to the departmental dose-optimization program which includes automated exposure control, adjustment of the mA and/or kV according to patient size and/or use of iterative reconstruction technique. COMPARISON:  None Available. FINDINGS: Brain: No evidence of large-territorial acute infarction. No parenchymal hemorrhage. 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 Otherwise no mass lesion. No extra-axial collection. No mass effect or midline shift. No hydrocephalus. Basilar cisterns are patent. Vascular: No hyperdense vessel. Skull: No acute fracture or focal lesion. Sinuses/Orbits: Paranasal sinuses and mastoid air cells are clear. The orbits are unremarkable.  Other: None. IMPRESSION: 1. No acute intracranial abnormality. 2. 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 Recommend outpatient follow-up with MRI brain with and without contrast. Electronically Signed   By: Tish Frederickson M.D.   On: 04/28/2023 00:53   DG Chest Port 1 View  Result Date: 04/28/2023 CLINICAL DATA:  Flulike illness EXAM: PORTABLE CHEST 1 VIEW COMPARISON:  Chest x-ray 10/21/2021 FINDINGS: The heart and mediastinal contours are within normal limits. Bibasilar atelectasis. No focal consolidation. No pulmonary edema. No pleural effusion. No pneumothorax. No acute osseous abnormality.  Old healed rib fractures. IMPRESSION: No active disease. Electronically Signed   By: Tish Frederickson M.D.   On: 04/28/2023 00:51   CT Renal Stone Study  Result Date: 04/28/2023 CLINICAL DATA:  Abdominal/flank pain, stone suspected Hematuria EXAM: CT ABDOMEN AND PELVIS WITHOUT CONTRAST TECHNIQUE: Multidetector CT imaging of the abdomen and pelvis was performed following the standard protocol without IV contrast. RADIATION DOSE REDUCTION: This exam was performed according to the departmental dose-optimization program which includes automated exposure control, adjustment of the mA and/or kV according to patient size and/or use of iterative reconstruction technique. COMPARISON:  None Available. FINDINGS: Lower chest: No acute abnormality. Hepatobiliary: No focal liver abnormality. No gallstones, gallbladder wall thickening, or pericholecystic fluid. No biliary dilatation. Pancreas: No focal lesion. Normal pancreatic contour. No surrounding inflammatory changes. No main pancreatic ductal dilatation. Spleen: Normal in size without focal abnormality. Adrenals/Urinary Tract: No adrenal nodule bilaterally. No nephrolithiasis and no hydronephrosis. No definite contour-deforming renal mass. No ureterolithiasis or hydroureter. The urinary bladder is unremarkable. Stomach/Bowel: Stomach is within normal limits. No evidence of bowel  wall thickening or dilatation. Nonspecific distension of the terminal ileum up to 3.1 cm in caliber with no associated bowel wall thickening or pericolonic fat stranding. Appendix appears normal. Vascular/Lymphatic: No abdominal aorta or iliac aneurysm. No abdominal, pelvic, or inguinal lymphadenopathy. Reproductive: Prostate is unremarkable. Other: No intraperitoneal free fluid. No intraperitoneal free gas. No organized fluid collection. Musculoskeletal: No abdominal wall hernia or abnormality. Sclerotic lesion of the right femur likely a bone island. No suspicious lytic or blastic osseous lesions. No acute  displaced fracture. Multilevel degenerative changes of the spine. IMPRESSION: No acute intra-abdominal or intrapelvic abnormality with limited evaluation on this noncontrast study. Electronically Signed   By: Tish Frederickson M.D.   On: 04/28/2023 00:47    Procedures Procedures    Medications Ordered in ED Medications  0.9 %  sodium chloride infusion (has no administration in time range)  sodium chloride 0.9 % bolus 1,000 mL (1,000 mLs Intravenous New Bag/Given 04/28/23 0002)  ondansetron (ZOFRAN) injection 4 mg (4 mg Intravenous Given 04/28/23 0000)  HYDROmorphone (DILAUDID) injection 1 mg (1 mg Intravenous Given 04/28/23 0000)  HYDROmorphone (DILAUDID) injection 1 mg (1 mg Intravenous Given 04/28/23 0148)  ondansetron (ZOFRAN) injection 4 mg (4 mg Intravenous Given 04/28/23 0148)    ED Course/ Medical Decision Making/ A&P                             Medical Decision Making Amount and/or Complexity of Data Reviewed Labs: ordered. Radiology: ordered.  Risk Prescription drug management.   Patient's workup here blood cultures were done and are pending.  No evidence of findings consistent with sepsis.  Lactic acid not elevated.  White count was elevated some at 11.5 but less than 14.  Platelets were normal complete metabolic panel without significant abnormalities renal function was normal  urinalysis did have a fair amount of hematuria RBCs were 21-50 COVID testing was negative chest x-ray was negative CT renal stone study done because of the hematuria no acute process there and CT head without any acute process other than a cyst may require follow-up with MRI will have primary care doctor follow-up for that.  Patient still with some headache and a little bit of sore throat COVID was negative may be viral.  Not sure that and do not think it is a tickborne illness because he is on doxycycline not sure what caused the cellulitis just isolated to the heel of the right foot.  Patient given precautions return for anything new or worse.  Will have him take extra strength Tylenol Motrin and given Zofran.  Work note provided.  He will continue the doxycycline he knows that that is important.  Return for anything new or worse  No neck stiffness.  Nothing consistent with meningitis at this point in time.  Will return for any neck stiffness development or any worsening symptoms.  Final Clinical Impression(s) / ED Diagnoses Final diagnoses:  Cellulitis of right lower extremity  Hematuria, unspecified type  Viral syndrome    Rx / DC Orders ED Discharge Orders          Ordered    ondansetron (ZOFRAN-ODT) 4 MG disintegrating tablet  Every 8 hours PRN        04/28/23 0141              Vanetta Mulders, MD 04/28/23 0159

## 2023-04-28 NOTE — Discharge Instructions (Addendum)
Continue the antibiotic doxycycline.  Workup today without evidence of sepsis.  CT head without any acute findings but it did show a cyst that may require MRI follow-up with your primary care doctor.  Return for any new or worse symptoms.  COVID testing negative.  CT of abdomen without any acute explanation for the blood in the urine.  Take the Zofran ODT as needed for nausea.  Also also okay to take extra strength Tylenol 2 tablets every 8 hours and can take 800 mg of Motrin every 8 hours.  Follow-up with urology information provided above and follow-up with your regular doctor.  Today's workup showed no evidence of sepsis.  There is hematuria but no explanation for the hematuria.

## 2023-04-29 DIAGNOSIS — Z6832 Body mass index (BMI) 32.0-32.9, adult: Secondary | ICD-10-CM | POA: Diagnosis not present

## 2023-04-29 DIAGNOSIS — L0390 Cellulitis, unspecified: Secondary | ICD-10-CM | POA: Diagnosis not present

## 2023-04-29 DIAGNOSIS — R519 Headache, unspecified: Secondary | ICD-10-CM | POA: Diagnosis not present

## 2023-04-29 LAB — CULTURE, BLOOD (ROUTINE X 2): Special Requests: ADEQUATE

## 2023-04-30 LAB — CULTURE, BLOOD (ROUTINE X 2): Culture: NO GROWTH

## 2023-05-01 LAB — CULTURE, BLOOD (ROUTINE X 2)

## 2023-05-03 LAB — CULTURE, BLOOD (ROUTINE X 2)
Culture: NO GROWTH
Special Requests: ADEQUATE

## 2023-06-13 DIAGNOSIS — S338XXA Sprain of other parts of lumbar spine and pelvis, initial encounter: Secondary | ICD-10-CM | POA: Diagnosis not present

## 2023-06-13 DIAGNOSIS — S233XXA Sprain of ligaments of thoracic spine, initial encounter: Secondary | ICD-10-CM | POA: Diagnosis not present

## 2023-06-13 DIAGNOSIS — S134XXA Sprain of ligaments of cervical spine, initial encounter: Secondary | ICD-10-CM | POA: Diagnosis not present

## 2023-06-20 DIAGNOSIS — S39012A Strain of muscle, fascia and tendon of lower back, initial encounter: Secondary | ICD-10-CM | POA: Diagnosis not present

## 2023-06-20 DIAGNOSIS — Z6832 Body mass index (BMI) 32.0-32.9, adult: Secondary | ICD-10-CM | POA: Diagnosis not present

## 2023-07-01 DIAGNOSIS — R7301 Impaired fasting glucose: Secondary | ICD-10-CM | POA: Diagnosis not present

## 2023-07-07 DIAGNOSIS — F900 Attention-deficit hyperactivity disorder, predominantly inattentive type: Secondary | ICD-10-CM | POA: Diagnosis not present

## 2023-07-07 DIAGNOSIS — K219 Gastro-esophageal reflux disease without esophagitis: Secondary | ICD-10-CM | POA: Diagnosis not present

## 2023-07-07 DIAGNOSIS — Z1389 Encounter for screening for other disorder: Secondary | ICD-10-CM | POA: Diagnosis not present

## 2023-07-07 DIAGNOSIS — F411 Generalized anxiety disorder: Secondary | ICD-10-CM | POA: Diagnosis not present

## 2023-07-07 DIAGNOSIS — J4530 Mild persistent asthma, uncomplicated: Secondary | ICD-10-CM | POA: Diagnosis not present

## 2023-07-07 DIAGNOSIS — Z1331 Encounter for screening for depression: Secondary | ICD-10-CM | POA: Diagnosis not present

## 2023-07-29 DIAGNOSIS — Z6833 Body mass index (BMI) 33.0-33.9, adult: Secondary | ICD-10-CM | POA: Diagnosis not present

## 2023-07-29 DIAGNOSIS — Z20828 Contact with and (suspected) exposure to other viral communicable diseases: Secondary | ICD-10-CM | POA: Diagnosis not present

## 2023-07-29 DIAGNOSIS — J069 Acute upper respiratory infection, unspecified: Secondary | ICD-10-CM | POA: Diagnosis not present

## 2023-07-31 ENCOUNTER — Ambulatory Visit
Admission: RE | Admit: 2023-07-31 | Discharge: 2023-07-31 | Disposition: A | Discharge status: Home / Self Care | Payer: BC Managed Care – PPO | Source: Ambulatory Visit | Attending: Nurse Practitioner | Admitting: Nurse Practitioner

## 2023-07-31 VITALS — BP 130/85 | HR 88 | Temp 98.2°F | Resp 20

## 2023-07-31 DIAGNOSIS — J039 Acute tonsillitis, unspecified: Secondary | ICD-10-CM | POA: Diagnosis not present

## 2023-07-31 LAB — POCT RAPID STREP A (OFFICE): Rapid Strep A Screen: NEGATIVE

## 2023-07-31 MED ORDER — LIDOCAINE VISCOUS HCL 2 % MT SOLN: OROMUCOSAL | 0 refills | Status: DC

## 2023-07-31 MED ORDER — AMOXICILLIN 500 MG PO CAPS: 500.0000 mg | ORAL_CAPSULE | Freq: Two times a day (BID) | ORAL | 0 refills | Status: AC

## 2023-07-31 NOTE — ED Triage Notes (Signed)
Pt reports he has a sore throat with sores on the back of his throat x 3 days

## 2023-07-31 NOTE — Discharge Instructions (Addendum)
The rapid strep test was negative today.  Based on your symptoms, I am going to treat you with an antibiotic.  As discussed, a throat culture has been ordered.  If the result of the culture is negative, you will be contacted and advised to stop the antibiotic. Take medication as prescribed. Increase fluids and allow for plenty of rest. Recommend over-the-counter Tylenol or Ibuprofen as needed for pain, fever, or general discomfort. Warm salt water gargles 3-4 times daily to help with throat pain or discomfort. Recommend a diet with soft foods to include soups, broths, puddings, yogurt, Jell-O's, or popsicles until symptoms improve. Change toothbrush after 3 days. Follow-up if symptoms do not improve.

## 2023-07-31 NOTE — ED Provider Notes (Signed)
[facility]    CSN: [id number] Arrival date & time: 07/31/23  0856      History   Chief Complaint Chief Complaint  Patient presents with   Sore Throat    HPI Michael Huber is a 32 y.o. male.   The history is provided by the patient.   Patient presents with a 3-day history of sore throat.  Patient states initially when symptoms started, he experienced subjective fever, body aches, and chills.  Patient states he did go to another office and was diagnosed with a viral infection.  Patient states he also had a COVID test performed there which was negative.  Patient states over the past 24 to 48 hours, he has since developed "sores" on the back of his throat.  He states that his throat pain has also worsened.  Patient denies difficulty swallowing, inability to swallow, drooling, cough, headache, abdominal pain, nausea, vomiting, or diarrhea.  Reports he has been taking over-the-counter ibuprofen for his symptoms.  Denies any obvious known sick contacts.  Past Medical History:  Diagnosis Date   Allergic rhinitis    Asthma    GERD (gastroesophageal reflux disease)     Patient Active Problem List   Diagnosis Date Noted   Acute bacterial rhinosinusitis 01/28/2023   Upper airway cough syndrome 01/28/2023   Tonsillar exudate 01/28/2023   Multiple rib fractures involving four or more ribs 10/26/2021   Moderate persistent asthma 10/26/2021   Allergic rhinitis 10/26/2021    History reviewed. No pertinent surgical history.     Home Medications    Prior to Admission medications   Medication Sig Start Date End Date Taking? Authorizing Provider  amoxicillin (AMOXIL) 500 MG capsule Take 1 capsule (500 mg total) by mouth 2 (two) times daily for 10 days. 07/31/23 08/10/23 Yes Cathryne Mancebo-Warren, Sadie Haber, NP  lidocaine (XYLOCAINE) 2 % solution Gargle and spit 5mL every 6 hours as needed for throat pain or discomfort. 07/31/23  Yes Kenitha Glendinning-Warren, Sadie Haber, NP  albuterol (VENTOLIN HFA)  108 (90 Base) MCG/ACT inhaler Inhale 2 puffs into the lungs 4 (four) times daily. 09/17/20   [provider]  amphetamine-dextroamphetamine (ADDERALL XR) 15 MG 24 hr capsule Take 15 mg by mouth every morning. 01/27/23   [provider]  benzonatate (TESSALON) 200 MG capsule Take 1 capsule (200 mg total) by mouth 3 (three) times daily as needed for cough. 01/28/23   Cobb, Ruby Cola, NP  Chlorpheniramine Maleate 12 MG TBCR Take 1 tablet (12 mg total) by mouth in the morning and at bedtime for 14 days. 02/16/23 03/02/23  Charlott Holler, MD  esomeprazole (NEXIUM) 40 MG capsule Take by mouth. 08/17/17   [provider]  FLUoxetine (PROZAC) 20 MG capsule Take 1 capsule by mouth daily. 08/09/17   [provider]  fluticasone (FLONASE) 50 MCG/ACT nasal spray Place 2 sprays into both nostrils daily. Patient not taking: Reported on 01/28/2023 09/13/21   Domenick Gong, MD  fluticasone-salmeterol (ADVAIR HFA) [phone] MCG/ACT inhaler Inhale 2 puffs into the lungs 2 (two) times daily. 08/11/22   Charlott Holler, MD  HYDROcodone bit-homatropine (HYCODAN) 5-1.5 MG/5ML syrup Take 5 mLs by mouth every 6 (six) hours as needed for cough. 02/09/23   Cobb, Ruby Cola, NP  methylphenidate 36 MG PO CR tablet Take 1 tablet by mouth daily. Patient not taking: Reported on 02/16/2023 08/06/17   [provider]  montelukast (SINGULAIR) 10 MG tablet Take 1 tablet (10 mg total) by mouth at bedtime. 08/11/22  Charlott Holler, MD  ondansetron (ZOFRAN-ODT) 4 MG disintegrating tablet Take 1 tablet (4 mg total) by mouth every 8 (eight) hours as needed for nausea or vomiting. 04/28/23   Vanetta Mulders, MD  predniSONE (DELTASONE) 10 MG tablet 4 tabs for 3 days, then 3 tabs for 3 days, 2 tabs for 3 days, then 1 tab for 3 days, then stop 02/09/23   Cobb, Ruby Cola, NP  pseudoephedrine (SUDAFED) 60 MG tablet Take 1 tablet (60 mg total) by mouth every 4 (four) hours as needed for congestion. 02/16/23    Charlott Holler, MD  Spacer/Aero-Holding Chambers (AEROCHAMBER PLUS) inhaler Use with inhaler 09/13/21   Domenick Gong, MD    Family History Family History  Problem Relation Age of Onset   Allergic Disorder Mother    Allergic rhinitis Brother    Asthma Neg Hx     Social History Social History   Tobacco Use   Smoking status: Never   Smokeless tobacco: Never  Vaping Use   Vaping status: Never Used  Substance Use Topics   Alcohol use: Yes   Drug use: Not Currently     Allergies   Patient has no known allergies.   Review of Systems Review of Systems Per HPI  Physical Exam Triage Vital Signs ED Triage Vitals [07/31/23 0907]  Encounter Vitals Group     BP 130/85     Systolic BP Percentile      Diastolic BP Percentile      Pulse Rate 88     Resp 20     Temp 98.2 F (36.8 C)     Temp Source Oral     SpO2 95 %     Weight      Height      Head Circumference      Peak Flow      Pain Score 7     Pain Loc      Pain Education      Exclude from Growth Chart    No data found.  Updated Vital Signs BP 130/85 (BP Location: Right Arm)   Pulse 88   Temp 98.2 F (36.8 C) (Oral)   Resp 20   SpO2 95%   Visual Acuity Right Eye Distance:   Left Eye Distance:   Bilateral Distance:    Right Eye Near:   Left Eye Near:    Bilateral Near:     Physical Exam Vitals and nursing note reviewed.  Constitutional:      General: He is not in acute distress.    Appearance: He is well-developed.  HENT:     Head: Normocephalic.     Right Ear: Tympanic membrane and ear canal normal.     Left Ear: Tympanic membrane and ear canal normal.     Nose: Congestion present.     Mouth/Throat:     Lips: Pink.     Mouth: Mucous membranes are moist.     Pharynx: Pharyngeal swelling, oropharyngeal exudate, posterior oropharyngeal erythema and postnasal drip present.     Tonsils: Tonsillar exudate present. 2+ on the right. 2+ on the left.  Eyes:     Conjunctiva/sclera:  Conjunctivae normal.     Pupils: Pupils are equal, round, and reactive to light.  Cardiovascular:     Rate and Rhythm: Normal rate and regular rhythm.     Heart sounds: Normal heart sounds.  Pulmonary:     Effort: Pulmonary effort is normal. No respiratory distress.     Breath sounds:  Normal breath sounds. No stridor. No wheezing, rhonchi or rales.  Abdominal:     General: Bowel sounds are normal.     Palpations: Abdomen is soft.     Tenderness: There is no abdominal tenderness.  Musculoskeletal:     Cervical back: Normal range of motion.  Lymphadenopathy:     Cervical: No cervical adenopathy.  Skin:    General: Skin is warm and dry.  Neurological:     General: No focal deficit present.     Mental Status: He is alert and oriented to person, place, and time.  Psychiatric:        Mood and Affect: Mood normal.        Behavior: Behavior normal.      UC Treatments / Results  Labs (all labs ordered are listed, but only abnormal results are displayed) Labs Reviewed  CULTURE, GROUP A STREP [facility])  POCT RAPID STREP A (OFFICE)    EKG   Radiology No results found.  Procedures Procedures (including critical care time)  Medications Ordered in UC Medications - No data to display  Initial Impression / Assessment and Plan / UC Course  I have reviewed the triage vital signs and the nursing notes.  Pertinent labs & imaging results that were available during my care of the patient were reviewed by me and considered in my medical decision making (see chart for details).  The patient is well-appearing, he is in no acute distress, vital signs are stable.  On exam, patient does have +2 tonsil swelling with exudate present.  He also does have some will oropharyngeal petechiae.  Rapid strep test was negative.  Will treat empirically with amoxicillin 500 mg, and viscous lidocaine 2% for patient to gargle and spit for throat pain or discomfort.  Throat culture is pending.  Supportive care  recommendations were provided and discussed with the patient to include continuing over-the-counter analgesics, warm salt water gargles, use of Chloraseptic throat spray and throat lozenges, discarding the toothbrush after 3 days, and a soft diet.  Patient was advised if the culture result is negative, he will be contacted and advised to stop the antibiotic.  Patient was given follow-up precautions.  Patient is in agreement with this plan of care and verbalizes understanding.  All questions were answered.  Patient stable for discharge.  Final Clinical Impressions(s) / UC Diagnoses   Final diagnoses:  Acute tonsillitis, unspecified etiology     Discharge Instructions      The rapid strep test was negative today.  Based on your symptoms, I am going to treat you with an antibiotic.  As discussed, a throat culture has been ordered.  If the result of the culture is negative, you will be contacted and advised to stop the antibiotic. Take medication as prescribed. Increase fluids and allow for plenty of rest. Recommend over-the-counter Tylenol or Ibuprofen as needed for pain, fever, or general discomfort. Warm salt water gargles 3-4 times daily to help with throat pain or discomfort. Recommend a diet with soft foods to include soups, broths, puddings, yogurt, Jell-O's, or popsicles until symptoms improve. Change toothbrush after 3 days. Follow-up if symptoms do not improve.      ED Prescriptions     Medication Sig Dispense Auth. Provider   lidocaine (XYLOCAINE) 2 % solution Gargle and spit 5mL every 6 hours as needed for throat pain or discomfort. 100 mL Juston Goheen-Warren, Sadie Haber, NP   amoxicillin (AMOXIL) 500 MG capsule Take 1 capsule (500 mg total) by mouth 2 (  two) times daily for 10 days. 20 capsule Amond Speranza-Warren, Sadie Haber, NP      PDMP not reviewed this encounter.   Abran Cantor, NP 07/31/23 [phone]

## 2023-08-02 LAB — CULTURE, GROUP A STREP (THRC)

## 2023-09-05 DIAGNOSIS — S134XXA Sprain of ligaments of cervical spine, initial encounter: Secondary | ICD-10-CM | POA: Diagnosis not present

## 2023-09-05 DIAGNOSIS — S233XXA Sprain of ligaments of thoracic spine, initial encounter: Secondary | ICD-10-CM | POA: Diagnosis not present

## 2023-09-05 DIAGNOSIS — S338XXA Sprain of other parts of lumbar spine and pelvis, initial encounter: Secondary | ICD-10-CM | POA: Diagnosis not present

## 2023-09-07 ENCOUNTER — Encounter: Payer: Self-pay | Admitting: Urology

## 2023-09-07 ENCOUNTER — Ambulatory Visit (INDEPENDENT_AMBULATORY_CARE_PROVIDER_SITE_OTHER): Payer: BC Managed Care – PPO | Admitting: Urology

## 2023-09-07 VITALS — BP 136/94 | HR 90

## 2023-09-07 DIAGNOSIS — Z3009 Encounter for other general counseling and advice on contraception: Secondary | ICD-10-CM | POA: Diagnosis not present

## 2023-09-07 MED ORDER — DIAZEPAM 10 MG PO TABS: 10.0000 mg | ORAL_TABLET | Freq: Once | ORAL | 0 refills | Status: AC

## 2023-09-07 NOTE — Progress Notes (Signed)
09/07/2023 9:45 AM   Michael Huber 1991/05/29 [id number]  Referring provider: Estanislado Pandy, MD [address] [place],  [place] [address]  Vasectomy consult   HPI: Michael Huber is a 32yo here for evaluation for vasectomy. He has 3 healthy children. No scrotal surgeries. No issues with erections.    PMH: Past Medical History:  Diagnosis Date   Allergic rhinitis    Asthma    GERD (gastroesophageal reflux disease)     Surgical History: No past surgical history on file.  Home Medications:  Allergies as of 09/07/2023   No Known Allergies      Medication List        Accurate as of September 07, 2023  9:45 AM. If you have any questions, ask your nurse or doctor.          AeroChamber Plus inhaler Use with inhaler   albuterol 108 (90 Base) MCG/ACT inhaler Commonly known as: VENTOLIN HFA Inhale 2 puffs into the lungs 4 (four) times daily.   amphetamine-dextroamphetamine 15 MG 24 hr capsule Commonly known as: ADDERALL XR Take 15 mg by mouth every morning.   benzonatate 200 MG capsule Commonly known as: TESSALON Take 1 capsule (200 mg total) by mouth 3 (three) times daily as needed for cough.   Chlorpheniramine Maleate 12 MG Tbcr Take 1 tablet (12 mg total) by mouth in the morning and at bedtime for 14 days.   esomeprazole 40 MG capsule Commonly known as: NEXIUM Take by mouth.   FLUoxetine 20 MG capsule Commonly known as: PROZAC Take 1 capsule by mouth daily.   fluticasone 50 MCG/ACT nasal spray Commonly known as: FLONASE Place 2 sprays into both nostrils daily.   fluticasone-salmeterol 230-21 MCG/ACT inhaler Commonly known as: Advair HFA Inhale 2 puffs into the lungs 2 (two) times daily.   HYDROcodone bit-homatropine 5-1.5 MG/5ML syrup Commonly known as: HYCODAN Take 5 mLs by mouth every 6 (six) hours as needed for cough.   lidocaine 2 % solution Commonly known as: XYLOCAINE Gargle and spit 5mL every 6 hours as needed for throat pain or  discomfort.   methylphenidate 36 MG CR tablet Commonly known as: CONCERTA Take 1 tablet by mouth daily.   montelukast 10 MG tablet Commonly known as: SINGULAIR Take 1 tablet (10 mg total) by mouth at bedtime.   ondansetron 4 MG disintegrating tablet Commonly known as: ZOFRAN-ODT Take 1 tablet (4 mg total) by mouth every 8 (eight) hours as needed for nausea or vomiting.   predniSONE 10 MG tablet Commonly known as: DELTASONE 4 tabs for 3 days, then 3 tabs for 3 days, 2 tabs for 3 days, then 1 tab for 3 days, then stop   pseudoephedrine 60 MG tablet Commonly known as: SUDAFED Take 1 tablet (60 mg total) by mouth every 4 (four) hours as needed for congestion.        Allergies: No Known Allergies  Family History: Family History  Problem Relation Age of Onset   Allergic Disorder Mother    Allergic rhinitis Brother    Asthma Neg Hx     Social History:  reports that he has never smoked. He has never used smokeless tobacco. He reports current alcohol use. He reports that he does not currently use drugs.  ROS: All other review of systems were reviewed and are negative except what is noted above in HPI  Physical Exam: There were no vitals taken for this visit.  Constitutional:  Alert and oriented, No acute  distress. HEENT: [place] AT, moist mucus membranes.  Trachea midline, no masses. Cardiovascular: No clubbing, cyanosis, or edema. Respiratory: Normal respiratory effort, no increased work of breathing. GI: Abdomen is soft, nontender, nondistended, no abdominal masses GU: No CVA tenderness. Circumcised phallus. No masses/lesions on penis, testis, scrotum. Bilateral vas deferens palpable Lymph: No cervical or inguinal lymphadenopathy. Skin: No rashes, bruises or suspicious lesions. Neurologic: Grossly intact, no focal deficits, moving all 4 extremities. Psychiatric: Normal mood and affect.  Laboratory Data: Lab Results  Component Value Date   WBC 11.5 (H) 04/27/2023   HGB  14.4 04/27/2023   HCT 43.8 04/27/2023   MCV 85.9 04/27/2023   PLT 346 04/27/2023    Lab Results  Component Value Date   CREATININE 0.77 04/27/2023    No results found for: "PSA"  No results found for: "TESTOSTERONE"  No results found for: "HGBA1C"  Urinalysis    Component Value Date/Time   COLORURINE YELLOW 04/27/2023 1902   APPEARANCEUR CLEAR 04/27/2023 1902   LABSPEC 1.025 04/27/2023 1902   PHURINE 6.0 04/27/2023 1902   GLUCOSEU NEGATIVE 04/27/2023 1902   HGBUR MODERATE (A) 04/27/2023 1902   BILIRUBINUR NEGATIVE 04/27/2023 1902   KETONESUR NEGATIVE 04/27/2023 1902   PROTEINUR 30 (A) 04/27/2023 1902   NITRITE NEGATIVE 04/27/2023 1902   LEUKOCYTESUR NEGATIVE 04/27/2023 1902    Lab Results  Component Value Date   BACTERIA RARE (A) 04/27/2023    Pertinent Imaging:  No results found for this or any previous visit.  No results found for this or any previous visit.  No results found for this or any previous visit.  No results found for this or any previous visit.  No results found for this or any previous visit.  No valid procedures specified. No results found for this or any previous visit.  Results for orders placed during the hospital encounter of 04/27/23  CT Renal Stone Study  Narrative CLINICAL DATA:  Abdominal/flank pain, stone suspected Hematuria  EXAM: CT ABDOMEN AND PELVIS WITHOUT CONTRAST  TECHNIQUE: Multidetector CT imaging of the abdomen and pelvis was performed following the standard protocol without IV contrast.  RADIATION DOSE REDUCTION: This exam was performed according to the departmental dose-optimization program which includes automated exposure control, adjustment of the mA and/or kV according to patient size and/or use of iterative reconstruction technique.  COMPARISON:  None Available.  FINDINGS: Lower chest: No acute abnormality.  Hepatobiliary: No focal liver abnormality. No gallstones, gallbladder wall thickening, or  pericholecystic fluid. No biliary dilatation.  Pancreas: No focal lesion. Normal pancreatic contour. No surrounding inflammatory changes. No main pancreatic ductal dilatation.  Spleen: Normal in size without focal abnormality.  Adrenals/Urinary Tract:  No adrenal nodule bilaterally.  No nephrolithiasis and no hydronephrosis. No definite contour-deforming renal mass.  No ureterolithiasis or hydroureter.  The urinary bladder is unremarkable.  Stomach/Bowel: Stomach is within normal limits. No evidence of bowel wall thickening or dilatation. Nonspecific distension of the terminal ileum up to 3.1 cm in caliber with no associated bowel wall thickening or pericolonic fat stranding. Appendix appears normal.  Vascular/Lymphatic: No abdominal aorta or iliac aneurysm. No abdominal, pelvic, or inguinal lymphadenopathy.  Reproductive: Prostate is unremarkable.  Other: No intraperitoneal free fluid. No intraperitoneal free gas. No organized fluid collection.  Musculoskeletal:  No abdominal wall hernia or abnormality.  Sclerotic lesion of the right femur likely a bone island. No suspicious lytic or blastic osseous lesions. No acute displaced fracture. Multilevel degenerative changes of the spine.  IMPRESSION: No acute intra-abdominal or intrapelvic  abnormality with limited evaluation on this noncontrast study.   Electronically Signed By: Tish Frederickson M.D. On: 04/28/2023 00:47   Assessment & Plan:    1. Vasectomy evaluation Schedule fro vasectomy RX for valium sent to pharmacy   No follow-ups on file.  Wilkie Aye, MD  [facility] [place]

## 2023-09-07 NOTE — Patient Instructions (Signed)
Vasectomy Vasectomy is a procedure to cut and then tie or burn the ends of the vas deferens. The vas deferens is a tube that carries sperm from the testicle to the urethra. This procedure blocks sperm from being released during sex. This ensures that sperm does not go into the vagina. A vasectomy does not affect your ability to have sex or your desire for sex. Also, it does not prevent sexually transmitted infections, or STIs. Vasectomy is a permanent and effective form of birth control. You should have a vasectomy only when you and your partner are sure you do not want children in the future. Do not get this procedure when you are stressed, such as after divorce or pregnancy loss. Tell a health care provider about: Any allergies you have. All medicines you are taking. These include vitamins, herbs, eye drops, creams, and over-the-counter medicines. Any problems you or family members have had with anesthesia. Any bleeding problems you have. Any surgeries you have had. Any medical conditions you have. What are the risks? Your provider will talk with you about risks. These may include: Infection. Bleeding and swelling of the scrotum. The scrotum is the sac that contains the testicles. Allergies to medicines. Failure of the procedure to prevent pregnancy. There is a very small chance that the tied or burned parts of the vas deferens may reconnect. If this happens, you could still make a person pregnant. Pain in the scrotum that goes on after you heal from the procedure. What happens before the procedure? Medicines Ask your health care provider about: Changing or stopping your regular medicines. These include any diabetes medicines or blood thinners you take. Taking medicines such as aspirin and ibuprofen. These medicines can thin your blood. Do not take them unless your provider tells you to take them. Taking over-the-counter medicines, vitamins, herbs, and supplements. You may be told to take a  sedative a few hours before the procedure. A sedative helps you relax. Surgery safety Ask your provider: How your surgery site will be marked. What steps will be taken to help prevent infection. These steps may include: Removing hair at the surgery site. Washing skin with a soap that kills germs. Taking antibiotics. General instructions Do not use any products that contain nicotine or tobacco for at least 4 weeks before the procedure. These products include cigarettes, chewing tobacco, and vaping devices, such as e-cigarettes. If you need help quitting, ask your provider. If you'll be going home right after the procedure, plan to have a responsible adult: Take you home from the hospital or clinic. You'll not be allowed to drive. Care for you for the time you are told. What happens during the procedure?  You may be given: A sedative. This helps you relax. You may also be told to take this a few hours before the procedure. Anesthesia. This keeps you from feeling pain. It will numb certain areas of your body. Your provider will feel for your vas deferens. To get to the vas deferens, your provider may: Make a very small cut, or incision, in your scrotum. Make a hole by piercing the scrotum. Your vas deferens will be pulled out of your scrotum and cut. To close it, the cut ends of the vas deferens will be tied or burned. The vas deferens will be put back into your scrotum. The cut or the hole in the scrotum will be closed with stitches. The stitches will dissolve and will not need to be removed. The procedure will be done  again on the other side of your scrotum. The procedure may vary among providers and hospitals. What happens after the procedure? You will be monitored to make sure that you do not have problems. You will be asked not to ejaculate for at least 1 week after the procedure, or for as long as you are told. You will need to use another form of birth control for 2-4 months after  the procedure. Do this until your provider confirms that there's no sperm in your semen. You may be given something to wear to support your scrotum. This includes a jockstrap or underwear with a pouch. If you were given a sedative during your procedure, do not drive or use machines until your provider says that it's safe. This information is not intended to replace advice given to you by your health care provider. Make sure you discuss any questions you have with your health care provider. Document Revised: 01/18/2023 Document Reviewed: 01/18/2023 Elsevier Patient Education  2024 [employer].

## 2023-10-10 DIAGNOSIS — F900 Attention-deficit hyperactivity disorder, predominantly inattentive type: Secondary | ICD-10-CM | POA: Diagnosis not present

## 2023-10-10 DIAGNOSIS — F411 Generalized anxiety disorder: Secondary | ICD-10-CM | POA: Diagnosis not present

## 2023-10-10 DIAGNOSIS — K219 Gastro-esophageal reflux disease without esophagitis: Secondary | ICD-10-CM | POA: Diagnosis not present

## 2023-10-10 DIAGNOSIS — J4530 Mild persistent asthma, uncomplicated: Secondary | ICD-10-CM | POA: Diagnosis not present

## 2023-11-02 ENCOUNTER — Ambulatory Visit: Payer: BC Managed Care – PPO | Admitting: Urology

## 2023-11-02 DIAGNOSIS — Z3009 Encounter for other general counseling and advice on contraception: Secondary | ICD-10-CM

## 2023-11-02 DIAGNOSIS — Z302 Encounter for sterilization: Secondary | ICD-10-CM | POA: Diagnosis not present

## 2023-11-02 NOTE — Progress Notes (Signed)
11/02/23  CC: desires sterilization   HPI: Michael Huber is a 32yo here for vasectomy There were no vitals taken for this visit. NED. A&Ox3.   No respiratory distress   Abd soft, NT, ND Normal external genitalia with patent urethral meatus  A timeout was performed.  Patient's identity and consent was confirmed.  All questions were answered.   Bilateral Vasectomy Procedure  Pre-Procedure: - Patient's scrotum was prepped and draped for vasectomy. - The vas was palpated through the scrotal skin on the left. - 1% Xylocaine was injected into the skin and surrounding tissue for placement  - In a similar manner, the vas on the right was identified, anesthetized, and stabilized.  Procedure: - A sharp hemostat was used to make a small stab incision in the skin overlying the vas - The left vas was isolated and brought up through the incision exposing that structure. - Bleeding points were cauterized as they occurred. - The vas was free from the surrounding structures and brought to the view. - A segment was positioned for placement with a hemostat. - A second hemostat was placed and a small segment between the two hemostats and was removed for inspection. - Each end of the transected vas lumen was fulgurated/ obliterated using needlepoint electrocautery -A fascial interposition was performed on testicular end of the vas using #3-0 chromic suture -The same procedure was performed on the right. - A single suture of #3-0 chromic catgut was used to close each lateral scrotal skin incision - A dressing was applied.  Post-Procedure: - Patient was instructed in care of the operative area - A specimen is to be delivered in 12 weeks   -Another form of contraception is to be used until post vasectomy semen analysis  Wilkie Aye, MD

## 2023-11-02 NOTE — Patient Instructions (Signed)
Vasectomy Postoperative Instructions ? ?Please bring back a semen analysis in approximately 3 months.  ?Your semen analysis  will need to be taken to  ?[facility]  [address], [place], [place] [address] ?[phone] ? ? You will be given a sterile specimen cup. Please label the cup with your name, date of birth, date and time of collections.  ?What to Expect ? - slight redness, swelling and scant drainage along the incision ? - mild to moderate discomfort ? - black and blue (bruising) as the tissue heals ? - low grade fever ? - scrotal sensitivity and/or tenderness ?- Edges of the incision may pull apart and heal slowly, sometimes a knot may be present which remains for several months.  This is NORMAL and all part of the healing process. ?- if stitches are placed, they do not need to be removed ?- if you have pain or discomfort immediately after the vasectomy, you may use OTC pain medication for relief , ex: tylenol.  After local anesthetic wears off an ice pack will provide additional comfort and can also prevent swelling if used ? ?Activity ? - no sexual intercourse for at lease 5 days depending on comfort ? - no heavy lifting for 48-72 hours (anything over 5-10 lbs) ? ?Wound Care ? - shower only after 24 hours ? - no tub baths, hot tub, or pools for at least 7 days ? - ice packs for 48 hours: 30 minutes on and 30 minutes off ? ?Problem to Report ? - generalized redness ? - increased pain and swelling ? - fever greater than 101 F ? - significant drainage or bleeding from the wound ? ?TO DO ?- Ejaculations help to clear the passage of sperm, but you must use another from of birth control until you are told you may discontinue its use!! ?- You will be given a specimen cup to bring back a semen sample in 3 months to check and see if its clear of sperm.  Only after the semen is sent for analysis and is reported back as clear should you use this as your primary form of birth control!    ?

## 2023-11-07 ENCOUNTER — Encounter: Payer: Self-pay | Admitting: Urology

## 2023-11-28 DIAGNOSIS — S338XXA Sprain of other parts of lumbar spine and pelvis, initial encounter: Secondary | ICD-10-CM | POA: Diagnosis not present

## 2023-11-28 DIAGNOSIS — S134XXA Sprain of ligaments of cervical spine, initial encounter: Secondary | ICD-10-CM | POA: Diagnosis not present

## 2023-11-28 DIAGNOSIS — S233XXA Sprain of ligaments of thoracic spine, initial encounter: Secondary | ICD-10-CM | POA: Diagnosis not present

## 2024-02-06 ENCOUNTER — Telehealth: Payer: Self-pay | Admitting: Urology

## 2024-02-06 NOTE — Telephone Encounter (Signed)
 Patient called wants to know what he needs to do to follow up for his Vasectomy?  When does he do the specimens , where does he take them and when does he follow up with [facility]

## 2024-03-01 ENCOUNTER — Encounter: Payer: Self-pay | Admitting: Urology

## 2024-03-01 DIAGNOSIS — Z9852 Vasectomy status: Secondary | ICD-10-CM

## 2024-03-01 DIAGNOSIS — Z3009 Encounter for other general counseling and advice on contraception: Secondary | ICD-10-CM

## 2024-03-02 ENCOUNTER — Telehealth: Payer: Self-pay | Admitting: Urology

## 2024-03-02 NOTE — Telephone Encounter (Signed)
Wants results of semen analysis

## 2024-03-06 NOTE — Telephone Encounter (Signed)
 Please see mychart message.

## 2024-03-13 LAB — POST VAS SEMEN ANALYSIS, AUA
Total Immotile Sperm: 0.02 x10E6/mL (ref <0.10)
Volume: 6.5 mL

## 2024-04-16 DIAGNOSIS — S338XXA Sprain of other parts of lumbar spine and pelvis, initial encounter: Secondary | ICD-10-CM | POA: Diagnosis not present

## 2024-04-16 DIAGNOSIS — S233XXA Sprain of ligaments of thoracic spine, initial encounter: Secondary | ICD-10-CM | POA: Diagnosis not present

## 2024-04-16 DIAGNOSIS — S134XXA Sprain of ligaments of cervical spine, initial encounter: Secondary | ICD-10-CM | POA: Diagnosis not present

## 2024-05-11 DIAGNOSIS — D3131 Benign neoplasm of right choroid: Secondary | ICD-10-CM | POA: Diagnosis not present

## 2024-05-16 ENCOUNTER — Other Ambulatory Visit: Payer: Self-pay | Admitting: Internal Medicine

## 2024-05-16 NOTE — Telephone Encounter (Signed)
 FYI Only or Action Required?: FYI only for provider  Patient is followed in Pulmonology for Asthma, last seen on 02/16/2023 by Aleck Hurdle, MD. Called Nurse Triage reporting No chief complaint on file.. Symptoms began today. Interventions attempted: Nothing. Symptoms are: No symptoms, medication refill request.  Triage Disposition: No disposition on file.  Patient/caregiver understands and will follow disposition?:

## 2024-05-16 NOTE — Telephone Encounter (Signed)
 Copied from CRM [id number]. Topic: Clinical - Medication Refill >> May 16, 2024  2:43 PM Dyann Glaser G wrote: Medication: albuterol (VENTOLIN HFA) 108 (90 Base) MCG/ACT inhaler  Has the patient contacted their pharmacy? Yes (Agent: If no, request that the patient contact the pharmacy for the refill. If patient does not wish to contact the pharmacy document the reason why and proceed with request.) (Agent: If yes, when and what did the pharmacy advise?)  This is the patient's preferred pharmacy:    [facility] [place], [place] - [id number] [place] [address] Phone: [phone] Fax: [phone]  Is this the correct pharmacy for this prescription? Yes If no, delete pharmacy and type the correct one.   Has the prescription been filled recently? Yes  Is the patient out of the medication? Yes  Has the patient been seen for an appointment in the last year OR does the patient have an upcoming appointment? Yes  Can we respond through MyChart? Yes  Agent: Please be advised that Rx refills may take up to 3 business days. We ask that you follow-up with your pharmacy.

## 2024-06-12 ENCOUNTER — Ambulatory Visit (INDEPENDENT_AMBULATORY_CARE_PROVIDER_SITE_OTHER): Admitting: Internal Medicine

## 2024-06-12 ENCOUNTER — Encounter: Payer: Self-pay | Admitting: Internal Medicine

## 2024-06-12 VITALS — BP 128/76 | HR 76 | Ht 72.0 in | Wt 233.2 lb

## 2024-06-12 DIAGNOSIS — K219 Gastro-esophageal reflux disease without esophagitis: Secondary | ICD-10-CM

## 2024-06-12 DIAGNOSIS — J4530 Mild persistent asthma, uncomplicated: Secondary | ICD-10-CM

## 2024-06-12 DIAGNOSIS — J301 Allergic rhinitis due to pollen: Secondary | ICD-10-CM

## 2024-06-12 MED ORDER — AZELASTINE-FLUTICASONE 137-50 MCG/ACT NA SUSP: 1.0000 | Freq: Every day | NASAL | 11 refills | Status: AC

## 2024-06-12 MED ORDER — FLUTICASONE-SALMETEROL 230-21 MCG/ACT IN AERO: 2.0000 | INHALATION_SPRAY | Freq: Two times a day (BID) | RESPIRATORY_TRACT | 12 refills | Status: AC

## 2024-06-12 MED ORDER — ALBUTEROL SULFATE HFA 108 (90 BASE) MCG/ACT IN AERS: 2.0000 | INHALATION_SPRAY | Freq: Four times a day (QID) | RESPIRATORY_TRACT | 5 refills | Status: AC

## 2024-06-12 NOTE — Patient Instructions (Signed)
 It was a pleasure to see you today!  Please schedule follow up with myself in 1 year.  If my schedule is not open yet, we will contact you with a reminder closer to that time. Please call [phone] if you haven't heard from us  a month before, and always call us  sooner if issues or concerns arise. You can also send us  a message through MyChart, but but aware that this is not to be used for urgent issues and it may take up to 5-7 days to receive a reply. Please be aware that you will likely be able to view your results before I have a chance to respond to them. Please give us  5 business days to respond to any non-urgent results.   continue advair  230, but drop down to 1 puff twice a day. continue albuterol  as needed, try using before exercise/exertion to see if it enables you to go further.  continue zyrtec and singulair . I am changing your fluticasone  nasal spray to fluticasone -azelastine  combination. continue nasal saline spray as needed Continue acid reflux medication.   I recommend starting to use the advair  daily as prescribed if you have symptoms of asthma such as chest tightness, wheezing, coughing, shortness of breath. You should also start using it as prescribed if you have exposure to a sick contact, worsening allergies, or any other trigger for your asthma. I recommend you keep using it even after your respiratory symptoms resolve for 3-4 days. The goal of this therapy is to prevent your symptoms from becoming a flare severe enough to require steroids like prednisone .   Please call our office if using this inhaler as prescribed. You might need to be seen sooner than our scheduled follow up.

## 2024-06-12 NOTE — Progress Notes (Signed)
 Michael Huber    [id number]    March 23, 1991  Primary Care Physician:Sasser, Deward ORN, MD Date of Appointment: 06/12/2024 Established Patient Visit  Chief complaint:   Chief Complaint  Patient presents with   Follow-up     HPI: Michael Huber is a 33 y.o. man with moderate persistent asthma.  Interval Updates: Here for asthma follow up.   No interval prednisone  in the last year.  Has been exercising, feels breathing is better over all in the last 4 months.   Still having nasal drainage and congestion in the morning.  Taking flonase  twice a day. Still with cetirizine and montelukast .    Current Regimen:  advair  230 2 puffs twice a day. albuterol  prn Asthma Triggers: taking flonase , allergies Exacerbations in the last year: 2 since feb 2024 for URI.  History of hospitalization or intubation: none Allergy  Testing: none GERD: still on PPI Allergic Rhinitis: yes still. 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   I have reviewed the patient's family social and past medical history and updated as appropriate.   Past Medical History:  Diagnosis Date   Allergic rhinitis    Asthma    GERD (gastroesophageal reflux disease)     History reviewed. No pertinent surgical history.  Family History  Problem Relation Age of Onset   Allergic Disorder Mother    Allergic rhinitis Brother    Asthma Neg Hx     Social History   Occupational History   Not on file  Tobacco Use   Smoking status: Never   Smokeless tobacco: Never  Vaping Use   Vaping status: Never Used  Substance and Sexual Activity   Alcohol  use: Yes   Drug use: Not Currently   Sexual activity: Not on file     Physical Exam: Blood pressure 128/76, pulse 76, height 6' (1.829 m), weight 233 lb 3.2 oz (105.8 kg), SpO2 96%.  Gen:       No distress, well appearing ENT: no thrush Lungs:    CTAB no wheezes or crackles CV:       RRR no mrg   Data Reviewed: Imaging: I have previously personally reviewed the chest xray October 2021 which shows no acute cardiopulmonary process.  PFTs: No airflow limitation    Latest Ref Rng & Units 07/20/2021    3:54 PM  PFT Results  FVC-Pre L 6.14   FVC-Predicted Pre % 106   FVC-Post L 5.94   FVC-Predicted Post % 102   Pre FEV1/FVC % % 78   Post FEV1/FCV % % 81   FEV1-Pre L 4.77   FEV1-Predicted Pre % 102   FEV1-Post L 4.78    I have personally reviewed the patient's PFTs and normal pulmonary function.   Labs: CMP, CBC and AP reviewed - wnl  Immunization status: Immunization History  Administered Date(s) Administered   Influenza,inj,Quad PF,6+ Mos 09/26/2020, 08/28/2021   Moderna Sars-Covid-2 Vaccination 12/29/2019, 01/26/2020, 10/11/2020    Assessment:  Mild persistent asthma, controlled GERD controlled Seasonal allergic rhinitis, suboptimal control  Plan/Recommendations: continue advair  230, but drop down to 1 puff twice a day. continue albuterol  as needed, try using before exercise/exertion to see if it enables you to go further.  continue zyrtec and singulair . I am changing  your fluticasone  nasal spray to fluticasone -azelastine  combination. continue nasal saline spray as needed Continue acid reflux medication.    Return to Care: Return in about 1 year (around 06/12/2025).   Verdon Gore, MD Pulmonary and Critical Care Medicine [facility] Office:[phone]

## 2024-07-16 DIAGNOSIS — S338XXA Sprain of other parts of lumbar spine and pelvis, initial encounter: Secondary | ICD-10-CM | POA: Diagnosis not present

## 2024-07-16 DIAGNOSIS — S233XXA Sprain of ligaments of thoracic spine, initial encounter: Secondary | ICD-10-CM | POA: Diagnosis not present

## 2024-07-16 DIAGNOSIS — S134XXA Sprain of ligaments of cervical spine, initial encounter: Secondary | ICD-10-CM | POA: Diagnosis not present

## 2024-08-24 DIAGNOSIS — S338XXA Sprain of other parts of lumbar spine and pelvis, initial encounter: Secondary | ICD-10-CM | POA: Diagnosis not present

## 2024-08-24 DIAGNOSIS — S134XXA Sprain of ligaments of cervical spine, initial encounter: Secondary | ICD-10-CM | POA: Diagnosis not present

## 2024-08-24 DIAGNOSIS — S233XXA Sprain of ligaments of thoracic spine, initial encounter: Secondary | ICD-10-CM | POA: Diagnosis not present

## 2024-08-30 DIAGNOSIS — S134XXA Sprain of ligaments of cervical spine, initial encounter: Secondary | ICD-10-CM | POA: Diagnosis not present

## 2024-08-30 DIAGNOSIS — S233XXA Sprain of ligaments of thoracic spine, initial encounter: Secondary | ICD-10-CM | POA: Diagnosis not present

## 2024-08-30 DIAGNOSIS — S338XXA Sprain of other parts of lumbar spine and pelvis, initial encounter: Secondary | ICD-10-CM | POA: Diagnosis not present

## 2024-10-17 ENCOUNTER — Encounter: Payer: Self-pay | Admitting: Internal Medicine

## 2024-11-09 DIAGNOSIS — S233XXA Sprain of ligaments of thoracic spine, initial encounter: Secondary | ICD-10-CM | POA: Diagnosis not present

## 2024-11-09 DIAGNOSIS — S338XXA Sprain of other parts of lumbar spine and pelvis, initial encounter: Secondary | ICD-10-CM | POA: Diagnosis not present

## 2024-11-09 DIAGNOSIS — S134XXA Sprain of ligaments of cervical spine, initial encounter: Secondary | ICD-10-CM | POA: Diagnosis not present

## 2024-11-20 DIAGNOSIS — S233XXA Sprain of ligaments of thoracic spine, initial encounter: Secondary | ICD-10-CM | POA: Diagnosis not present

## 2024-11-20 DIAGNOSIS — S338XXA Sprain of other parts of lumbar spine and pelvis, initial encounter: Secondary | ICD-10-CM | POA: Diagnosis not present

## 2024-11-20 DIAGNOSIS — S134XXA Sprain of ligaments of cervical spine, initial encounter: Secondary | ICD-10-CM | POA: Diagnosis not present

## 2024-12-17 ENCOUNTER — Telehealth: Payer: Self-pay

## 2024-12-17 NOTE — Telephone Encounter (Signed)
 Copied from CRM [id number]. Topic: Appointments - Transfer of Care >> Dec 17, 2024 10:54 AM [name] wrote: Pt is requesting to transfer FROM: Michael Huber  Pt is requesting to transfer TO: [facility] Reason for requested transfer: Proivder gone  It is the responsibility of the team the patient would like to transfer to (Dr. Theodoro) to reach out to the patient if for any reason this transfer is not acceptable.   Patient has upcoming appointment with requested MD   -NFN

## 2024-12-22 ENCOUNTER — Other Ambulatory Visit: Payer: Self-pay

## 2024-12-22 ENCOUNTER — Ambulatory Visit
Admission: EM | Admit: 2024-12-22 | Discharge: 2024-12-22 | Disposition: A | Discharge status: Home / Self Care | Attending: Family Medicine | Admitting: Family Medicine

## 2024-12-22 ENCOUNTER — Encounter: Payer: Self-pay | Admitting: Emergency Medicine

## 2024-12-22 DIAGNOSIS — H01004 Unspecified blepharitis left upper eyelid: Secondary | ICD-10-CM | POA: Diagnosis not present

## 2024-12-22 MED ORDER — PREDNISONE 20 MG PO TABS: 40.0000 mg | ORAL_TABLET | Freq: Every day | ORAL | 0 refills | Status: AC

## 2024-12-22 MED ORDER — ERYTHROMYCIN 5 MG/GM OP OINT: TOPICAL_OINTMENT | OPHTHALMIC | 0 refills | Status: AC

## 2024-12-22 NOTE — ED Provider Notes (Signed)
" °  [facility]   [id number] 12/22/24 Arrival Time: 1242  ASSESSMENT & PLAN:  1. Blepharitis of left upper eyelid, unspecified type    Meds ordered this encounter  Medications   erythromycin  ophthalmic ointment    Sig: Place a 1/2 inch ribbon of ointment into the upper eyelid BID-TID for the next 5-7 days..    Dispense:  3.5 g    Refill:  0   predniSONE  (DELTASONE ) 20 MG tablet    Sig: Take 2 tablets (40 mg total) by mouth daily.    Dispense:  10 tablet    Refill:  0   Local eye care discussed. May f/u as needed.  Reviewed expectations re: course of current medical issues. Questions answered. Outlined signs and symptoms indicating need for more acute intervention. Patient verbalized understanding. After Visit Summary given.   SUBJECTIVE:  Michael Huber is a 34 y.o. male who presents with complaint of itching/swelling of L upper eyelid; x few days. Denies injury trauma. Normal vision. Does wear contacts but has been wearing glasses for the past sev days. No tx PTA.   OBJECTIVE:  Vitals:   12/22/24 1245  BP: (!) 145/85  Pulse: (!) 109  Resp: 20  Temp: 98.5 F (36.9 C)  TempSrc: Oral  SpO2: 96%    General appearance: alert; no distress HEENT: [place]; AT; PERRLA; no restriction of the extraocular movements OU: normal except for erythematous/swollen upper L eyelid; cannot r/o formation of stye Skin: warm and dry Psychological: alert and cooperative; normal mood and affect  @visualacuity @  Allergies[1]  Past Medical History:  Diagnosis Date   Allergic rhinitis    Asthma    GERD (gastroesophageal reflux disease)    Social History   Socioeconomic History   Marital status: Married    Spouse name: Not on file   Number of children: Not on file   Years of education: Not on file   Highest education level: Not on file  Occupational History   Not on file  Tobacco Use   Smoking status: Never   Smokeless tobacco: Never  Vaping Use   Vaping status: Never  Used  Substance and Sexual Activity   Alcohol  use: Yes   Drug use: Not Currently   Sexual activity: Not on file  Other Topics Concern   Not on file  Social History Narrative   Not on file   Social Drivers of Health   Tobacco Use: Low Risk (12/22/2024)   Patient History    Smoking Tobacco Use: Never    Smokeless Tobacco Use: Never    Passive Exposure: Not on file  Financial Resource Strain: Not on file  Food Insecurity: Not on file  Transportation Needs: Not on file  Physical Activity: Not on file  Stress: Not on file  Social Connections: Not on file  Intimate Partner Violence: Not on file  Depression (EYV7-0): Not on file  Alcohol  Screen: Not on file  Housing: Not on file  Utilities: Not on file  Health Literacy: Not on file   Family History  Problem Relation Age of Onset   Allergic Disorder Mother    Allergic rhinitis Brother    Asthma Neg Hx    History reviewed. No pertinent surgical history.     [1] No Known Allergies    Rolinda Rogue, MD 12/22/24 1328  "

## 2024-12-22 NOTE — ED Triage Notes (Addendum)
 Pt reports left eyelid,swelling, drainage since Thursday. Wears contacts at baseline. Currently wearing glasses in triage. Denies any known injury. Denies any changes in vision.

## 2025-02-19 ENCOUNTER — Encounter
